# Patient Record
Sex: Female | Born: 1980
Health system: Southern US, Community
[De-identification: ages and names within clinical notes are randomized; demographics above are authoritative.]

## PROBLEM LIST (undated history)

## (undated) ENCOUNTER — Inpatient Hospital Stay (HOSPITAL_COMMUNITY): Payer: Self-pay

## (undated) DIAGNOSIS — Z3490 Encounter for supervision of normal pregnancy, unspecified, unspecified trimester: Secondary | ICD-10-CM

## (undated) DIAGNOSIS — Z9889 Other specified postprocedural states: Secondary | ICD-10-CM

## (undated) DIAGNOSIS — O24419 Gestational diabetes mellitus in pregnancy, unspecified control: Secondary | ICD-10-CM

## (undated) HISTORY — DX: Morbid (severe) obesity due to excess calories: E66.01

## (undated) HISTORY — DX: Other specified postprocedural states: Z98.890

## (undated) HISTORY — DX: Encounter for supervision of normal pregnancy, unspecified, unspecified trimester: Z34.90

## (undated) HISTORY — PX: HERNIA REPAIR: SHX51

---
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
## 2004-12-24 ENCOUNTER — Emergency Department (HOSPITAL_COMMUNITY): Admission: EM | Admit: 2004-12-24 | Discharge: 2004-12-24 | Payer: Self-pay | Admitting: Emergency Medicine

## 2004-12-27 ENCOUNTER — Emergency Department (HOSPITAL_COMMUNITY): Admission: EM | Admit: 2004-12-27 | Discharge: 2004-12-27 | Payer: Self-pay | Admitting: Family Medicine

## 2006-04-14 ENCOUNTER — Emergency Department (HOSPITAL_COMMUNITY): Admission: EM | Admit: 2006-04-14 | Discharge: 2006-04-14 | Payer: Self-pay | Admitting: Emergency Medicine

## 2006-10-09 ENCOUNTER — Inpatient Hospital Stay (HOSPITAL_COMMUNITY): Admission: AD | Admit: 2006-10-09 | Discharge: 2006-10-09 | Payer: Self-pay | Admitting: Gynecology

## 2006-11-24 ENCOUNTER — Emergency Department (HOSPITAL_COMMUNITY): Admission: EM | Admit: 2006-11-24 | Discharge: 2006-11-24 | Payer: Self-pay | Admitting: Emergency Medicine

## 2008-01-13 ENCOUNTER — Emergency Department (HOSPITAL_COMMUNITY): Admission: EM | Admit: 2008-01-13 | Discharge: 2008-01-13 | Payer: Self-pay | Admitting: Emergency Medicine

## 2009-08-03 ENCOUNTER — Emergency Department (HOSPITAL_COMMUNITY): Admission: EM | Admit: 2009-08-03 | Discharge: 2009-08-03 | Payer: Self-pay | Admitting: Emergency Medicine

## 2010-12-05 ENCOUNTER — Ambulatory Visit: Payer: Self-pay | Admitting: Internal Medicine

## 2010-12-15 ENCOUNTER — Ambulatory Visit: Payer: Self-pay | Admitting: Internal Medicine

## 2010-12-16 ENCOUNTER — Telehealth: Payer: Self-pay | Admitting: Family Medicine

## 2010-12-16 ENCOUNTER — Ambulatory Visit (INDEPENDENT_AMBULATORY_CARE_PROVIDER_SITE_OTHER): Payer: 59 | Admitting: Family Medicine

## 2010-12-16 ENCOUNTER — Encounter: Payer: Self-pay | Admitting: Family Medicine

## 2010-12-16 VITALS — BP 106/76 | HR 95 | Temp 99.0°F | Ht 66.25 in | Wt 155.0 lb

## 2010-12-16 DIAGNOSIS — IMO0002 Reserved for concepts with insufficient information to code with codable children: Secondary | ICD-10-CM

## 2010-12-16 DIAGNOSIS — S76319A Strain of muscle, fascia and tendon of the posterior muscle group at thigh level, unspecified thigh, initial encounter: Secondary | ICD-10-CM

## 2010-12-16 MED ORDER — ETODOLAC 500 MG PO TABS: 500.0000 mg | ORAL_TABLET | Freq: Two times a day (BID) | ORAL | Status: AC

## 2010-12-16 NOTE — Progress Notes (Signed)
  Subjective:    Patient ID: Mackenzie Mcgee, female    DOB: [birth year], 30 y.o.   MRN: [id number]  HPI 30 yr old female to establish with [place] and to discuss left leg pain. About 3 months ago she developed a sharp pain behind the left knee which radiates up the back of the thigh to the mid thigh area. No hx of trauma but she does exercise by running on a treadmill about 5 days a week. No foot swelling. No back or buttock pain. No numbness or weakness in the leg. Using Motrin or Tylenol off and on.    Review of Systems  Constitutional: Negative.   Eyes: Negative.   Respiratory: Negative.   Musculoskeletal: Positive for myalgias. Negative for back pain, joint swelling, arthralgias and gait problem.       Objective:   Physical Exam  Constitutional: She appears well-developed and well-nourished.       In no distress. Gets on the exam table easily.   Musculoskeletal: She exhibits no edema.       Mildly tender in the posterior left thigh and the popliteal space. No masses or swelling  Psychiatric:       Tearful and a little anxious           Assessment & Plan:  This is probably a pulled hamstring. Use Etodolac for a month. Rest, use moist heat. No running for one month. Recheck prn

## 2010-12-16 NOTE — Telephone Encounter (Signed)
Pt is req to get a letter written to excuse pt from work from today until 12/26/10. Pt will return to work on 12/29/10. Pt also is req a referral to psychiatrist EA:[id number] at work.

## 2010-12-17 NOTE — Telephone Encounter (Signed)
Pt called to check on status of letter. Pt just wanted to make sure she could get it before she goes back to work on 12/29/10

## 2010-12-18 NOTE — Telephone Encounter (Signed)
No I will not write her out of work for 2 weeks. We never discussed her needing to be out of work. Her leg pain had been going on for 3 months. I cannot refer her to a psychiatrist because we never talked about her job stress at all.

## 2010-12-18 NOTE — Telephone Encounter (Signed)
Pt decline to come in to discuss job stress etc. Pt is aware doc will not take her out of work for 2 wks.

## 2011-01-02 LAB — BASIC METABOLIC PANEL
BUN: 3 — ABNORMAL LOW
Calcium: 9.2
Chloride: 105
Creatinine, Ser: 0.51
GFR calc non Af Amer: >60
Potassium: 3.6
Sodium: 136

## 2011-01-02 LAB — URINALYSIS, ROUTINE W REFLEX MICROSCOPIC
Ketones, ur: >80 — AB
Nitrite: NEGATIVE
Protein, ur: 30 — AB
Urobilinogen, UA: 1

## 2011-01-02 LAB — URINE MICROSCOPIC-ADD ON

## 2011-01-02 LAB — DIFFERENTIAL
Eosinophils Absolute: 0
Eosinophils Relative: 0
Lymphocytes Relative: 10 — ABNORMAL LOW
Lymphs Abs: 1.3
Monocytes Absolute: 0.7
Neutrophils Relative %: 83 — ABNORMAL HIGH

## 2011-01-02 LAB — CBC
HCT: 34.1 — ABNORMAL LOW
MCHC: 34
MCV: 85
Platelets: 280
RDW: 13.7
WBC: 12.5 — ABNORMAL HIGH

## 2011-01-02 LAB — POCT PREGNANCY, URINE: Preg Test, Ur: POSITIVE

## 2011-01-05 LAB — POCT PREGNANCY, URINE
Operator id: 140111
Preg Test, Ur: POSITIVE

## 2011-01-05 LAB — URINALYSIS, DIPSTICK ONLY
Bilirubin Urine: NEGATIVE
Leukocytes, UA: NEGATIVE
Nitrite: NEGATIVE
Protein, ur: NEGATIVE
Specific Gravity, Urine: 1.015
Urobilinogen, UA: 1
pH: 7.5

## 2011-01-05 LAB — URINE MICROSCOPIC-ADD ON

## 2011-01-05 LAB — URINALYSIS, ROUTINE W REFLEX MICROSCOPIC: Protein, ur: 100 — AB

## 2012-03-23 HISTORY — PX: WISDOM TOOTH EXTRACTION: SHX21

## 2014-11-25 ENCOUNTER — Encounter (HOSPITAL_BASED_OUTPATIENT_CLINIC_OR_DEPARTMENT_OTHER): Payer: Self-pay | Admitting: Emergency Medicine

## 2014-11-25 ENCOUNTER — Emergency Department (HOSPITAL_BASED_OUTPATIENT_CLINIC_OR_DEPARTMENT_OTHER)
Admission: EM | Admit: 2014-11-25 | Discharge: 2014-11-25 | Disposition: A | Discharge status: Home / Self Care | Payer: BLUE CROSS/BLUE SHIELD | Attending: Emergency Medicine | Admitting: Emergency Medicine

## 2014-11-25 DIAGNOSIS — Z720 Tobacco use: Secondary | ICD-10-CM | POA: Insufficient documentation

## 2014-11-25 DIAGNOSIS — H18829 Corneal disorder due to contact lens, unspecified eye: Secondary | ICD-10-CM | POA: Insufficient documentation

## 2014-11-25 DIAGNOSIS — T889XXA Complication of surgical and medical care, unspecified, initial encounter: Secondary | ICD-10-CM

## 2014-11-25 MED ORDER — TETRACAINE HCL 0.5 % OP SOLN
1.0000 [drp] | Freq: Once | OPHTHALMIC | Status: DC
  Filled 2014-11-25: qty 2

## 2014-11-25 NOTE — ED Provider Notes (Addendum)
CSN: [id number]     Arrival date & time 11/25/14  0535 History   First MD Initiated Contact with Patient 11/25/14 [phone]     Chief Complaint  Patient presents with  . Eye Problem    HPI Pt  wears hard contact lenses.  She is visiting from [place] and forgot the plunger device she uses to remove her contact lens.    Her eyes are starting to feel dry and she needs to remove her lenses.  No other complaints.  No drainage. History reviewed. No pertinent past medical history. History reviewed. No pertinent past surgical history. Family History  Problem Relation Age of Onset  . Alcohol abuse Father   . Depression Father    Social History  Substance Use Topics  . Smoking status: Current Every Day Smoker -- 0.50 packs/day for 4 years    Types: Cigarettes  . Smokeless tobacco: Never Used  . Alcohol Use: 1.0 oz/week    2 drink(s) per week   OB History    No data available     Review of Systems  Constitutional: Negative for fever.  Eyes: Negative for photophobia and visual disturbance.      Allergies  Review of patient's allergies indicates no known allergies.  Home Medications   Prior to Admission medications   Not on File   BP 99/60 mmHg  Pulse 69  Temp(Src) 98.2 F (36.8 C) (Oral)  Resp 16  Ht  (1.651 m)  Wt 176 lb (79.833 kg)  BMI 29.29 kg/m2  SpO2 100%  LMP 11/21/2014 Physical Exam  Constitutional: She appears well-developed and well-nourished. No distress.  HENT:  Head: Normocephalic and atraumatic.  Right Ear: External ear normal.  Left Ear: External ear normal.  Eyes: Conjunctivae are normal. Right eye exhibits no discharge. Left eye exhibits no discharge. No scleral icterus.  Contact lenses in place  Neck: Neck supple. No tracheal deviation present.  Cardiovascular: Normal rate.   Pulmonary/Chest: Effort normal. No stridor. No respiratory distress.  Musculoskeletal: She exhibits no edema.  Neurological: She is alert. Cranial nerve deficit: no gross  deficits.  Skin: Skin is warm and dry. No rash noted.  Psychiatric: She has a normal mood and affect.  Nursing note and vitals reviewed.   ED Course  Procedures (including critical care time)   MDM   Final diagnoses:  Contact lens stuck, initial encounter    Pt was given the contact lens remover.  She was able to remove the lens on her own without difficulty.    Linwood Dibbles, MD 11/25/14 [phone]

## 2014-11-25 NOTE — ED Notes (Signed)
Patient reports that she can not her contact out.

## 2017-02-16 ENCOUNTER — Inpatient Hospital Stay (HOSPITAL_COMMUNITY)
Admission: AD | Admit: 2017-02-16 | Discharge: 2017-02-16 | Disposition: A | Discharge status: Home / Self Care | Payer: 59 | Source: Ambulatory Visit | Attending: Obstetrics & Gynecology | Admitting: Obstetrics & Gynecology

## 2017-02-16 ENCOUNTER — Encounter (HOSPITAL_COMMUNITY): Payer: Self-pay | Admitting: *Deleted

## 2017-02-16 DIAGNOSIS — N898 Other specified noninflammatory disorders of vagina: Secondary | ICD-10-CM

## 2017-02-16 DIAGNOSIS — Z3483 Encounter for supervision of other normal pregnancy, third trimester: Secondary | ICD-10-CM | POA: Diagnosis present

## 2017-02-16 DIAGNOSIS — O9989 Other specified diseases and conditions complicating pregnancy, childbirth and the puerperium: Secondary | ICD-10-CM | POA: Diagnosis not present

## 2017-02-16 DIAGNOSIS — Z3A35 35 weeks gestation of pregnancy: Secondary | ICD-10-CM | POA: Diagnosis not present

## 2017-02-16 DIAGNOSIS — Z0371 Encounter for suspected problem with amniotic cavity and membrane ruled out: Secondary | ICD-10-CM

## 2017-02-16 HISTORY — DX: Gestational diabetes mellitus in pregnancy, unspecified control: O24.419

## 2017-02-16 LAB — POCT FERN TEST: POCT FERN TEST: NEGATIVE

## 2017-02-16 NOTE — Discharge Instructions (Signed)

## 2017-02-16 NOTE — MAU Note (Signed)
Gush of fluid about 1350.  Denies ctx's, having some pressure. Receives prenatal care in [place]. Was here for Thanksgiving.   Was just told her GBS is positive.

## 2017-02-16 NOTE — MAU Provider Note (Signed)
S: Ms. Mackenzie MallowStephanie M Mcgee is a 36 y.o. G1P0 at 7174w6d  who presents to MAU today complaining of leaking of fluid since 30 minutes PTA. She denies vaginal bleeding. She denies contractions. She reports normal fetal movement.    O: BP 117/60 (BP Location: Left Arm)   Pulse 66   Temp 98.4 F (36.9 C) (Oral)   Resp 16  GENERAL: Well-developed, well-nourished female in no acute distress.  HEAD: Normocephalic, atraumatic.  CHEST: Normal effort of breathing, regular heart rate ABDOMEN: Soft, nontender, gravid PELVIC: Normal external female genitalia. Vagina is pink and rugated. Cervix with normal contour, no lesions. Normal discharge.  No pooling.   Cervical exam:  Dilation: Fingertip Effacement (%): 50 Exam by:: Estanislado SpireE. Mansi Tokar NP   Fetal Monitoring: Baseline: 150 Variability: moderatee Accelerations: 15x15 Decelerations: none Contractions: irr ctx  Results for orders placed or performed during the hospital encounter of 02/16/17 (from the past 24 hour(s))  POCT fern test     Status: None   Collection Time: 02/16/17  3:13 PM  Result Value Ref Range   POCT Fern Test Negative = intact amniotic membranes      A: SIUP at 3174w6d  Membranes intact  P: Discharge home Has scheduled appt tomorrow with OB in [place]  Alonso Gapinski, Denny PeonErin, NP 02/16/2017 3:01 PM

## 2017-02-22 ENCOUNTER — Encounter (HOSPITAL_COMMUNITY): Payer: Self-pay | Admitting: *Deleted

## 2017-02-22 ENCOUNTER — Inpatient Hospital Stay (HOSPITAL_COMMUNITY)
Admission: AD | Admit: 2017-02-22 | Discharge: 2017-02-22 | Disposition: A | Discharge status: Home / Self Care | Payer: 59 | Source: Ambulatory Visit | Attending: Obstetrics and Gynecology | Admitting: Obstetrics and Gynecology

## 2017-02-22 DIAGNOSIS — O26853 Spotting complicating pregnancy, third trimester: Secondary | ICD-10-CM | POA: Insufficient documentation

## 2017-02-22 DIAGNOSIS — O98813 Other maternal infectious and parasitic diseases complicating pregnancy, third trimester: Secondary | ICD-10-CM | POA: Insufficient documentation

## 2017-02-22 DIAGNOSIS — O24419 Gestational diabetes mellitus in pregnancy, unspecified control: Secondary | ICD-10-CM | POA: Insufficient documentation

## 2017-02-22 DIAGNOSIS — Z79899 Other long term (current) drug therapy: Secondary | ICD-10-CM | POA: Insufficient documentation

## 2017-02-22 DIAGNOSIS — B373 Candidiasis of vulva and vagina: Secondary | ICD-10-CM | POA: Insufficient documentation

## 2017-02-22 DIAGNOSIS — Z3A36 36 weeks gestation of pregnancy: Secondary | ICD-10-CM | POA: Diagnosis not present

## 2017-02-22 DIAGNOSIS — Z7984 Long term (current) use of oral hypoglycemic drugs: Secondary | ICD-10-CM | POA: Insufficient documentation

## 2017-02-22 DIAGNOSIS — Z87891 Personal history of nicotine dependence: Secondary | ICD-10-CM | POA: Insufficient documentation

## 2017-02-22 DIAGNOSIS — R109 Unspecified abdominal pain: Secondary | ICD-10-CM | POA: Diagnosis not present

## 2017-02-22 DIAGNOSIS — O26893 Other specified pregnancy related conditions, third trimester: Secondary | ICD-10-CM | POA: Diagnosis not present

## 2017-02-22 DIAGNOSIS — B3731 Acute candidiasis of vulva and vagina: Secondary | ICD-10-CM

## 2017-02-22 DIAGNOSIS — Z3689 Encounter for other specified antenatal screening: Secondary | ICD-10-CM

## 2017-02-22 LAB — WET PREP, GENITAL
Clue Cells Wet Prep HPF POC: NONE SEEN
Sperm: NONE SEEN
Trich, Wet Prep: NONE SEEN
Yeast Wet Prep HPF POC: NONE SEEN

## 2017-02-22 MED ORDER — FLUCONAZOLE 150 MG PO TABS: 150.0000 mg | ORAL_TABLET | Freq: Once | ORAL | 0 refills | Status: AC

## 2017-02-22 NOTE — MAU Provider Note (Signed)
History     CSN: [id number]  Arrival date and time: 02/22/17 1738   First Provider Initiated Contact with Patient 02/22/17 1819      Chief Complaint  Patient presents with  . Abdominal Pain  . Vaginal Bleeding   G1 @36 .5 wks here with spotting. She had 1 episode 3 days ago and another today. She saw pink on the toilet paper. No recent IC. No vaginal discharge, itch, or odor. No LOF or ctx. Reports good FM. She is receiving care in [place] and her pregnancy is complicated by A2GDM on Metformin. She reports EFW 66%ile and nml AFV recently.    OB History    Gravida Para Term Preterm AB Living   1             SAB TAB Ectopic Multiple Live Births                  Past Medical History:  Diagnosis Date  . Gestational diabetes     Past Surgical History:  Procedure Laterality Date  . HERNIA REPAIR    . WISDOM TOOTH EXTRACTION  2014    Family History  Problem Relation Age of Onset  . Alcohol abuse Father   . Depression Father     Social History   Tobacco Use  . Smoking status: Former Smoker    Packs/day: 0.50    Years: 4.00    Pack years: 2.00    Types: Cigarettes  . Smokeless tobacco: Never Used  Substance Use Topics  . Alcohol use: No    Alcohol/week: 1.0 oz    Types: 2 Standard drinks or equivalent per week    Frequency: Never  . Drug use: No    Allergies: No Known Allergies  Medications Prior to Admission  Medication Sig Dispense Refill Last Dose  . metFORMIN (GLUCOPHAGE) 500 MG tablet Take by mouth at bedtime.   02/15/2017 at Unknown time  . ondansetron (ZOFRAN) 4 MG tablet Take 4 mg by mouth every 8 (eight) hours as needed for nausea or vomiting.   01/29/2017  . Prenatal Vit-Fe Fumarate-FA (PRENATAL MULTIVITAMIN) TABS tablet Take 1 tablet by mouth daily at 12 noon.   02/15/2017 at Unknown time    Review of Systems  Gastrointestinal: Negative for abdominal pain.  Genitourinary: Positive for vaginal bleeding. Negative for vaginal discharge.    Physical Exam   Blood pressure (!) 102/58, pulse 82, temperature 98.2 F (36.8 C), resp. rate 16, height 5' 4.5" (1.638 m), weight 212 lb (96.2 kg), SpO2 100 %.  Physical Exam  Nursing note and vitals reviewed. Constitutional: She is oriented to person, place, and time. She appears well-developed and well-nourished. No distress.  HENT:  Head: Normocephalic and atraumatic.  Neck: Normal range of motion.  Cardiovascular: Normal rate.  Respiratory: Effort normal.  GI: Soft. She exhibits no distension. There is no tenderness.  gravid  Genitourinary:  Genitourinary Comments: External: no lesions or erythema Vagina: rugated, pink, moist, moderate thick white curdy and adherent discharge Cervix FT/thick   Musculoskeletal: Normal range of motion.  Neurological: She is alert and oriented to person, place, and time.  Skin: Skin is warm and dry.  Psychiatric: She has a normal mood and affect.  EFM: 140 bpm, mod variability, + accels, no decels Toco: none  Results for orders placed or performed during the hospital encounter of 02/22/17 (from the past 24 hour(s))  Wet prep, genital     Status: Abnormal   Collection Time: 02/22/17  6:35 PM  Result Value Ref Range   Yeast Wet Prep HPF POC NONE SEEN NONE SEEN   Trich, Wet Prep NONE SEEN NONE SEEN   Clue Cells Wet Prep HPF POC NONE SEEN NONE SEEN   WBC, Wet Prep HPF POC MANY (A) NONE SEEN   Sperm NONE SEEN    MAU Course  Procedures  MDM Labs ordered and reviewed. No evidence of PTL or abruption. Spotting likely caused by vaginal irritation from yeast. Will treat with Diflucan. Stable for discharge home.  Assessment and Plan   1. [redacted] weeks gestation of pregnancy   2. NST (non-stress test) reactive   3. Yeast vaginitis    Discharge home Follow up in OB office as scheduled in 2 days PTL precautions  Allergies as of 02/22/2017   No Known Allergies     Medication List    TAKE these medications   fluconazole 150 MG  tablet Commonly known as:  DIFLUCAN Take 1 tablet (150 mg total) by mouth once for 1 dose. May repeat dose on day 4   metFORMIN 500 MG tablet Commonly known as:  GLUCOPHAGE Take by mouth at bedtime.   ondansetron 4 MG tablet Commonly known as:  ZOFRAN Take 4 mg by mouth every 8 (eight) hours as needed for nausea or vomiting.   prenatal multivitamin Tabs tablet Take 1 tablet by mouth daily at 12 noon.      Donette LarryMelanie Eulogia Dismore, CNM 02/22/2017, 7:02 PM

## 2017-02-22 NOTE — Discharge Instructions (Signed)
Vaginal Yeast infection, Adult Vaginal yeast infection is a condition that causes soreness, swelling, and redness (inflammation) of the vagina. It also causes vaginal discharge. This is a common condition. Some women get this infection frequently. What are the causes? This condition is caused by a change in the normal balance of the yeast (candida) and bacteria that live in the vagina. This change causes an overgrowth of yeast, which causes the inflammation. What increases the risk? This condition is more likely to develop in:  Women who take antibiotic medicines.  Women who have diabetes.  Women who take birth control pills.  Women who are pregnant.  Women who douche often.  Women who have a weak defense (immune) system.  Women who have been taking steroid medicines for a long time.  Women who frequently wear tight clothing.  What are the signs or symptoms? Symptoms of this condition include:  White, thick vaginal discharge.  Swelling, itching, redness, and irritation of the vagina. The lips of the vagina (vulva) may be affected as well.  Pain or a burning feeling while urinating.  Pain during sex.  How is this diagnosed? This condition is diagnosed with a medical history and physical exam. This will include a pelvic exam. Your health care provider will examine a sample of your vaginal discharge under a microscope. Your health care provider may send this sample for testing to confirm the diagnosis. How is this treated? This condition is treated with medicine. Medicines may be over-the-counter or prescription. You may be told to use one or more of the following:  Medicine that is taken orally.  Medicine that is applied as a cream.  Medicine that is inserted directly into the vagina (suppository).  Follow these instructions at home:  Take or apply over-the-counter and prescription medicines only as told by your health care provider.  Do not have sex until your health  care provider has approved. Tell your sex partner that you have a yeast infection. That person should go to his or her health care provider if he or she develops symptoms.  Do not wear tight clothes, such as pantyhose or tight pants.  Avoid using tampons until your health care provider approves.  Eat more yogurt. This may help to keep your yeast infection from returning.  Try taking a sitz bath to help with discomfort. This is a warm water bath that is taken while you are sitting down. The water should only come up to your hips and should cover your buttocks. Do this 3-4 times per day or as told by your health care provider.  Do not douche.  Wear breathable, cotton underwear.  If you have diabetes, keep your blood sugar levels under control. Contact a health care provider if:  You have a fever.  Your symptoms go away and then return.  Your symptoms do not get better with treatment.  Your symptoms get worse.  You have new symptoms.  You develop blisters in or around your vagina.  You have blood coming from your vagina and it is not your menstrual period.  You develop pain in your abdomen. This information is not intended to replace advice given to you by your health care provider. Make sure you discuss any questions you have with your health care provider. Document Released: 12/17/2004 Document Revised: 08/21/2015 Document Reviewed: 09/10/2014 Elsevier Interactive Patient Education  2018 [employer] Corporation[employer]xton Hicks Contractions Contractions of the uterus can occur throughout pregnancy, but they are not always a sign that you are  in labor. You may have practice contractions called Braxton Hicks contractions. These false labor contractions are sometimes confused with true labor. What are Deberah PeltonBraxton Hicks contractions? Braxton Hicks contractions are tightening movements that occur in the muscles of the uterus before labor. Unlike true labor contractions, these contractions do not  result in opening (dilation) and thinning of the cervix. Toward the end of pregnancy (32-34 weeks), Braxton Hicks contractions can happen more often and may become stronger. These contractions are sometimes difficult to tell apart from true labor because they can be very uncomfortable. You should not feel embarrassed if you go to the hospital with false labor. Sometimes, the only way to tell if you are in true labor is for your health care provider to look for changes in the cervix. The health care provider will do a physical exam and may monitor your contractions. If you are not in true labor, the exam should show that your cervix is not dilating and your water has not broken. If there are no prenatal problems or other health problems associated with your pregnancy, it is completely safe for you to be sent home with false labor. You may continue to have Braxton Hicks contractions until you go into true labor. How can I tell the difference between true labor and false labor?  Differences ? False labor ? Contractions last 30-70 seconds.: Contractions are usually shorter and not as strong as true labor contractions. ? Contractions become very regular.: Contractions are usually irregular. ? Discomfort is usually felt in the top of the uterus, and it spreads to the lower abdomen and low back.: Contractions are often felt in the front of the lower abdomen and in the groin. ? Contractions do not go away with walking.: Contractions may go away when you walk around or change positions while lying down. ? Contractions usually become more intense and increase in frequency.: Contractions get weaker and are shorter-lasting as time goes on. ? The cervix dilates and gets thinner.: The cervix usually does not dilate or become thin. Follow these instructions at home:  Take over-the-counter and prescription medicines only as told by your health care provider.  Keep up with your usual exercises and follow other  instructions from your health care provider.  Eat and drink lightly if you think you are going into labor.  If Braxton Hicks contractions are making you uncomfortable: ? Change your position from lying down or resting to walking, or change from walking to resting. ? Sit and rest in a tub of warm water. ? Drink enough fluid to keep your urine clear or pale yellow. Dehydration may cause these contractions. ? Do slow and deep breathing several times an hour.  Keep all follow-up prenatal visits as told by your health care provider. This is important. Contact a health care provider if:  You have a fever.  You have continuous pain in your abdomen. Get help right away if:  Your contractions become stronger, more regular, and closer together.  You have fluid leaking or gushing from your vagina.  You pass blood-tinged mucus (bloody show).  You have bleeding from your vagina.  You have low back pain that you never had before.  You feel your babys head pushing down and causing pelvic pressure.  Your baby is not moving inside you as much as it used to. Summary  Contractions that occur before labor are called Braxton Hicks contractions, false labor, or practice contractions.  Braxton Hicks contractions are usually shorter, weaker, farther apart, and  less regular than true labor contractions. True labor contractions usually become progressively stronger and regular and they become more frequent.  Manage discomfort from [facility] Hospital[facility]xton Hicks contractions by changing position, resting in a warm bath, drinking plenty of water, or practicing deep breathing. This information is not intended to replace advice given to you by your health care provider. Make sure you discuss any questions you have with your health care provider. Document Released: 03/09/2005 Document Revised: 01/27/2016 Document Reviewed: 01/27/2016 Elsevier Interactive Patient Education  2017 [employer]

## 2017-02-22 NOTE — MAU Note (Signed)
Pt reports some spotting off/on for 2 days, some pressure in her lower back.

## 2017-02-22 NOTE — MAU Note (Signed)
Urine in lab 

## 2017-02-24 ENCOUNTER — Encounter (HOSPITAL_COMMUNITY): Payer: Self-pay | Admitting: *Deleted

## 2017-02-24 ENCOUNTER — Inpatient Hospital Stay (HOSPITAL_COMMUNITY)
Admission: AD | Admit: 2017-02-24 | Discharge: 2017-02-24 | Disposition: A | Discharge status: Home / Self Care | Payer: 59 | Source: Ambulatory Visit | Attending: Obstetrics and Gynecology | Admitting: Obstetrics and Gynecology

## 2017-02-24 DIAGNOSIS — O9989 Other specified diseases and conditions complicating pregnancy, childbirth and the puerperium: Secondary | ICD-10-CM

## 2017-02-24 DIAGNOSIS — Z87891 Personal history of nicotine dependence: Secondary | ICD-10-CM | POA: Diagnosis not present

## 2017-02-24 DIAGNOSIS — B349 Viral infection, unspecified: Secondary | ICD-10-CM | POA: Diagnosis not present

## 2017-02-24 DIAGNOSIS — O98513 Other viral diseases complicating pregnancy, third trimester: Secondary | ICD-10-CM | POA: Insufficient documentation

## 2017-02-24 DIAGNOSIS — J029 Acute pharyngitis, unspecified: Secondary | ICD-10-CM | POA: Diagnosis present

## 2017-02-24 DIAGNOSIS — Z3A37 37 weeks gestation of pregnancy: Secondary | ICD-10-CM | POA: Diagnosis not present

## 2017-02-24 DIAGNOSIS — O24415 Gestational diabetes mellitus in pregnancy, controlled by oral hypoglycemic drugs: Secondary | ICD-10-CM | POA: Diagnosis not present

## 2017-02-24 LAB — CBC WITH DIFFERENTIAL/PLATELET
Basophils Absolute: 0 10*3/uL (ref 0.0–0.1)
Basophils Relative: 0 %
EOS ABS: 0 10*3/uL (ref 0.0–0.7)
Eosinophils Relative: 0 %
HEMATOCRIT: 36.4 % (ref 36.0–46.0)
HEMOGLOBIN: 11.9 g/dL — AB (ref 12.0–15.0)
LYMPHS ABS: 0.6 10*3/uL — AB (ref 0.7–4.0)
LYMPHS PCT: 5 %
MCH: 28 pg (ref 26.0–34.0)
MCHC: 32.7 g/dL (ref 30.0–36.0)
MCV: 85.6 fL (ref 78.0–100.0)
MONOS PCT: 5 %
Monocytes Absolute: 0.7 10*3/uL (ref 0.1–1.0)
NEUTROS ABS: 12 10*3/uL — AB (ref 1.7–7.7)
NEUTROS PCT: 90 %
Platelets: 210 10*3/uL (ref 150–400)
RBC: 4.25 MIL/uL (ref 3.87–5.11)
RDW: 14.4 % (ref 11.5–15.5)
WBC: 13.4 10*3/uL — ABNORMAL HIGH (ref 4.0–10.5)

## 2017-02-24 LAB — COMPREHENSIVE METABOLIC PANEL
ALK PHOS: 103 U/L (ref 38–126)
ALT: 10 U/L — ABNORMAL LOW (ref 14–54)
ANION GAP: 8 (ref 5–15)
AST: 17 U/L (ref 15–41)
Albumin: 2.9 g/dL — ABNORMAL LOW (ref 3.5–5.0)
BILIRUBIN TOTAL: 0.4 mg/dL (ref 0.3–1.2)
BUN: 6 mg/dL (ref 6–20)
CALCIUM: 8.5 mg/dL — AB (ref 8.9–10.3)
CHLORIDE: 104 mmol/L (ref 101–111)
CO2: 20 mmol/L — AB (ref 22–32)
CREATININE: 0.56 mg/dL (ref 0.44–1.00)
GLUCOSE: 131 mg/dL — AB (ref 65–99)
Potassium: 3.7 mmol/L (ref 3.5–5.1)
Sodium: 132 mmol/L — ABNORMAL LOW (ref 135–145)
Total Protein: 5.7 g/dL — ABNORMAL LOW (ref 6.5–8.1)

## 2017-02-24 LAB — INFLUENZA PANEL BY PCR (TYPE A & B)
INFLBPCR: NEGATIVE
Influenza A By PCR: NEGATIVE

## 2017-02-24 LAB — URINALYSIS, ROUTINE W REFLEX MICROSCOPIC
Bilirubin Urine: NEGATIVE
GLUCOSE, UA: 50 mg/dL — AB
Hgb urine dipstick: NEGATIVE
Ketones, ur: 20 mg/dL — AB
LEUKOCYTES UA: NEGATIVE
Nitrite: NEGATIVE
PROTEIN: NEGATIVE mg/dL
SPECIFIC GRAVITY, URINE: 1.016 (ref 1.005–1.030)
pH: 5 (ref 5.0–8.0)

## 2017-02-24 LAB — RAPID STREP SCREEN (MED CTR MEBANE ONLY): STREPTOCOCCUS, GROUP A SCREEN (DIRECT): NEGATIVE

## 2017-02-24 MED ORDER — LACTATED RINGERS IV BOLUS (SEPSIS)
500.0000 mL | Freq: Once | INTRAVENOUS | Status: AC
  Administered 2017-02-24: 500 mL via INTRAVENOUS

## 2017-02-24 MED ORDER — ACETAMINOPHEN 500 MG PO TABS
500.0000 mg | ORAL_TABLET | Freq: Once | ORAL | Status: AC
  Administered 2017-02-24: 500 mg via ORAL
  Filled 2017-02-24: qty 1

## 2017-02-24 NOTE — Discharge Instructions (Signed)

## 2017-02-24 NOTE — MAU Note (Signed)
PT SAYS SHE WAS HERE THIS AM  FOR YEAST  INFECTION.       SAYS TEMP AT HOME   AT 1030- 99.7   THEN AT 1150PM- 100.1 - SHE CALLED CALL- A NURSE- TOLD  TO COME IN.     ALSO HAS SORE THROAT ., BODY ACHES.    NO   V/D .   SOME NAUSEA- BUT  TAKES ZOFRAN  AT HOME .  [facility] [place]  [facility] - DR Vevelyn RoyalsLISA WILSON

## 2017-02-24 NOTE — MAU Provider Note (Signed)
Chief Complaint:  Fever and Sore Throat   None     HPI: Mackenzie Mcgee is a 36 y.o. G1P0 at 1664w0d who presents to MAU reporting fever and sore throat.  Patient states that she was just seen less than 24 hours ago for her vaginal discharge and treated for yeast infection.  However states that when she was at home she started developing a fever with a T-max of 100.1.  She called nurse and was told to come in.  Patient states that starting today she started having sore throat, body aches.  Denies any nausea or vomiting.  Denies diarrhea.  Patient states that she has had no sick exposures.  Being treated for gestational diabetes.  Receives prenatal care in [place].  Denies contractions, leakage of fluid, vaginal discharge, or vaginal bleeding. Good fetal movement.   Pregnancy Course:   Past Medical History: Past Medical History:  Diagnosis Date  . Gestational diabetes     Past obstetric history: OB History  Gravida Para Term Preterm AB Living  1            SAB TAB Ectopic Multiple Live Births               # Outcome Date GA Lbr Len/2nd Weight Sex Delivery Anes PTL Lv  1 Current               Past Surgical History: Past Surgical History:  Procedure Laterality Date  . HERNIA REPAIR    . WISDOM TOOTH EXTRACTION  2014     Family History: Family History  Problem Relation Age of Onset  . Alcohol abuse Father   . Depression Father     Social History: Social History   Tobacco Use  . Smoking status: Former Smoker    Packs/day: 0.50    Years: 4.00    Pack years: 2.00    Types: Cigarettes  . Smokeless tobacco: Never Used  Substance Use Topics  . Alcohol use: No    Alcohol/week: 1.0 oz    Types: 2 Standard drinks or equivalent per week    Frequency: Never  . Drug use: No    Allergies: No Known Allergies  Meds:  Medications Prior to Admission  Medication Sig Dispense Refill Last Dose  . metFORMIN (GLUCOPHAGE) 500 MG tablet Take by mouth at bedtime.    02/24/2017 at Unknown time  . ondansetron (ZOFRAN) 4 MG tablet Take 4 mg by mouth every 8 (eight) hours as needed for nausea or vomiting.   Past Week at Unknown time  . Prenatal Vit-Fe Fumarate-FA (PRENATAL MULTIVITAMIN) TABS tablet Take 1 tablet by mouth daily at 12 noon.   02/24/2017 at Unknown time    I have reviewed patient's Past Medical Hx, Surgical Hx, Family Hx, Social Hx, medications and allergies.   ROS:  All systems reviewed and are negative for acute change except as noted in the HPI.   Physical Exam   Patient Vitals for the past 24 hrs:  BP Temp Temp src Pulse Resp Height Weight  02/24/17 0048 122/67 99.8 F (37.7 C) Oral (!) 108 20 5' 4.5" (1.638 m) 96.8 kg (213 lb 8 oz)   Constitutional: Well-developed, well-nourished female in no acute distress.  HEENT: NCAT, o/p clear and tacky mucous membranes, no cervical lymphadenopathy, EOMI, no scleral injection or icterus Cardiovascular: tachycardia, normal rhythm, pulses intact Respiratory: normal rate and effort.  GI: Abd soft, non-tender, gravid appropriate for gestational age.  MS: Extremities nontender, no edema, normal ROM  Neurologic: Alert and oriented x 4. No focal deficits GU: Neg CVAT. Psych: normal mood and affect    Labs: Results for orders placed or performed during the hospital encounter of 02/24/17  Culture, group A strep  Result Value Ref Range   Specimen Description THROAT    Special Requests NONE    Culture      NO GROUP A STREP (S.PYOGENES) ISOLATED Performed at [facility], [address] [place], [place] [address]    Report Status 02/26/2017 FINAL   Rapid Strep Screen (Not at [facility])  Result Value Ref Range   Streptococcus, Group A Screen (Direct) NEGATIVE NEGATIVE  Culture, group A strep  Result Value Ref Range   Specimen Description THROAT    Special Requests NONE Reflexed from [id number]    Culture      NO GROUP A STREP (S.PYOGENES) ISOLATED Performed at [facility], [address] [place], [place] [address]    Report Status 02/26/2017 FINAL   CBC with Differential/Platelet  Result Value Ref Range   WBC 13.4 (H) 4.0 - 10.5 K/uL   RBC 4.25 3.87 - 5.11 MIL/uL   Hemoglobin 11.9 (L) 12.0 - 15.0 g/dL   HCT 46.936.4 62.936.0 - 52.846.0 %   MCV 85.6 78.0 - 100.0 fL   MCH 28.0 26.0 - 34.0 pg   MCHC 32.7 30.0 - 36.0 g/dL   RDW 41.314.4 24.411.5 - 01.015.5 %   Platelets 210 150 - 400 K/uL   Neutrophils Relative % 90 %   Neutro Abs 12.0 (H) 1.7 - 7.7 K/uL   Lymphocytes Relative 5 %   Lymphs Abs 0.6 (L) 0.7 - 4.0 K/uL   Monocytes Relative 5 %   Monocytes Absolute 0.7 0.1 - 1.0 K/uL   Eosinophils Relative 0 %   Eosinophils Absolute 0.0 0.0 - 0.7 K/uL   Basophils Relative 0 %   Basophils Absolute 0.0 0.0 - 0.1 K/uL  Comprehensive metabolic panel  Result Value Ref Range   Sodium 132 (L) 135 - 145 mmol/L   Potassium 3.7 3.5 - 5.1 mmol/L   Chloride 104 101 - 111 mmol/L   CO2 20 (L) 22 - 32 mmol/L   Glucose, Bld 131 (H) 65 - 99 mg/dL   BUN 6 6 - 20 mg/dL   Creatinine, Ser 2.720.56 0.44 - 1.00 mg/dL   Calcium 8.5 (L) 8.9 - 10.3 mg/dL   Total Protein 5.7 (L) 6.5 - 8.1 g/dL   Albumin 2.9 (L) 3.5 - 5.0 g/dL   AST 17 15 - 41 U/L   ALT 10 (L) 14 - 54 U/L   Alkaline Phosphatase 103 38 - 126 U/L   Total Bilirubin 0.4 0.3 - 1.2 mg/dL   GFR calc non Af Amer >60 >60 mL/min   GFR calc Af Amer >60 >60 mL/min   Anion gap 8 5 - 15  Influenza panel by PCR (type A & B)  Result Value Ref Range   Influenza A By PCR NEGATIVE NEGATIVE   Influenza B By PCR NEGATIVE NEGATIVE  Urinalysis, Routine w reflex microscopic  Result Value Ref Range   Color, Urine YELLOW YELLOW   APPearance CLEAR CLEAR   Specific Gravity, Urine 1.016 1.005 - 1.030   pH 5.0 5.0 - 8.0   Glucose, UA 50 (A) NEGATIVE mg/dL   Hgb urine dipstick NEGATIVE NEGATIVE   Bilirubin Urine NEGATIVE NEGATIVE   Ketones, ur 20 (A) NEGATIVE mg/dL   Protein, ur NEGATIVE NEGATIVE mg/dL   Nitrite  NEGATIVE NEGATIVE   Leukocytes, UA NEGATIVE NEGATIVE      Imaging:  No results found.  MAU Course: Vitals and nursing notes reviewed I have ordered labs and reviewed them Flu negative Rapid strep negative UA unremarkable CBC with mild leukocytosis with left shift Fetal tachycardia to 1802 on admission with some improvement with fluids and tylenol Treatments given in MAU: Tylenol and LR bolus  Discussed case with Dr. Jolayne Panther  I personally reviewed the patient's NST today, found to be REACTIVE. 170 bpm, mod var, +accels, no decels. CTX: None.   MDM: Plan of care reviewed with patient, including labs and tests ordered and medical treatment.   Assessment: 1. Viral illness   2. Fetal tachycardia before the onset of labor     Plan: Discharge home in stable condition.  Labor precautions and fetal kick counts reviewed Conservative measures for viral illness symtpoms (handout on medications safe in pregnancy given) Handout given Follow-up with OB provider   Caryl Ada, DO OB Fellow [facility], [facility] 02/24/2017 1:26 AM

## 2017-02-26 LAB — CULTURE, GROUP A STREP (THRC)

## 2017-06-09 ENCOUNTER — Encounter (HOSPITAL_COMMUNITY): Payer: Self-pay

## 2020-07-21 DIAGNOSIS — U071 COVID-19: Secondary | ICD-10-CM

## 2020-07-21 HISTORY — DX: COVID-19: U07.1

## 2020-08-05 ENCOUNTER — Emergency Department (INDEPENDENT_AMBULATORY_CARE_PROVIDER_SITE_OTHER)
Admission: EM | Admit: 2020-08-05 | Discharge: 2020-08-05 | Disposition: A | Discharge status: Home / Self Care | Payer: BC Managed Care – PPO | Source: Home / Self Care | Attending: Family Medicine | Admitting: Family Medicine

## 2020-08-05 ENCOUNTER — Encounter: Payer: Self-pay | Admitting: Emergency Medicine

## 2020-08-05 ENCOUNTER — Other Ambulatory Visit: Payer: Self-pay

## 2020-08-05 DIAGNOSIS — U071 COVID-19: Secondary | ICD-10-CM | POA: Diagnosis not present

## 2020-08-05 DIAGNOSIS — Z3201 Encounter for pregnancy test, result positive: Secondary | ICD-10-CM

## 2020-08-05 LAB — POCT URINE PREGNANCY: Preg Test, Ur: POSITIVE — AB

## 2020-08-05 LAB — POC SARS CORONAVIRUS 2 AG -  ED: SARS Coronavirus 2 Ag: POSITIVE — AB

## 2020-08-05 MED ORDER — ACETAMINOPHEN 500 MG PO TABS
1000.0000 mg | ORAL_TABLET | ORAL | Status: AC
  Administered 2020-08-05: 1000 mg via ORAL

## 2020-08-05 NOTE — Discharge Instructions (Addendum)
Take plain guaifenesin (1200mg  extended release tabs such as Mucinex) twice daily, with plenty of water, for cough and congestion.  Get adequate rest.   May take Tylenol as needed for fever, body aches, etc. Try warm salt water gargles for sore throat.  Stop all antihistamines (Allegra, etc) for now, and other non-prescription cough/cold preparations. May take Delsym Cough Suppressant ("12 Hour Cough Relief") at bedtime for nighttime cough.   Because your COVID-19 test is positive, isolate yourself for five days from today.  At the end of five days you may end isolation if your symptoms have cleared or improved, and you have not had a fever for 24 hours. At this time you should wear a mask for five more days when you are around others.   If symptoms become significantly worse during the night or over the weekend, proceed to the local emergency room.

## 2020-08-05 NOTE — ED Triage Notes (Addendum)
Scratchy throat since yesterday  Fever last night  (100)  Temp in triage 102.2  Tylenol last night Positive home COVID test this am  Requests test here for work Pt also found she was pregnant this morning  Covid vaccine - booster [facility] 12/21

## 2020-08-05 NOTE — ED Provider Notes (Signed)
[facility]    CSN: [id number] Arrival date & time: 08/05/20  1011      History   Chief Complaint Chief Complaint  Patient presents with  . Sore Throat  . Fever    HPI Mackenzie Mcgee is a 40 y.o. female.   Patient developed sore throat yesterday along with myalgias, nasal congestion, fatigue, and headache.  Today she developed fever to 100.  This morning she had positive home pregnancy and COVID19 tests.  She denies pelvic/abdominal pain and vaginal bleeding.  Patient's last menstrual period was 07/12/2020 (exact date).  Patient has had COVID19 vaccination and a Moderna booster.  The history is provided by the patient.    Past Medical History:  Diagnosis Date  . Gestational diabetes     There are no problems to display for this patient.   Past Surgical History:  Procedure Laterality Date  . HERNIA REPAIR    . WISDOM TOOTH EXTRACTION  2014    OB History    Gravida  2   Para      Term      Preterm      AB      Living        SAB      IAB      Ectopic      Multiple      Live Births               Home Medications    Prior to Admission medications   Medication Sig Start Date End Date Taking? Authorizing Provider  metFORMIN (GLUCOPHAGE) 500 MG tablet Take by mouth at bedtime. Patient not taking: Reported on 08/05/2020    [provider]  ondansetron (ZOFRAN) 4 MG tablet Take 4 mg by mouth every 8 (eight) hours as needed for nausea or vomiting. Patient not taking: Reported on 08/05/2020    [provider]  Prenatal Vit-Fe Fumarate-FA (PRENATAL MULTIVITAMIN) TABS tablet Take 1 tablet by mouth daily at 12 noon. Patient not taking: Reported on 08/05/2020    [provider]    Family History Family History  Problem Relation Age of Onset  . Alcohol abuse Father   . Depression Father   . Healthy Mother     Social History Social History   Tobacco Use  . Smoking status: Former Smoker     Packs/day: 0.50    Years: 4.00    Pack years: 2.00    Types: Cigarettes  . Smokeless tobacco: Never Used  Substance Use Topics  . Alcohol use: No  . Drug use: No     Allergies   Patient has no known allergies.   Review of Systems Review of Systems  + sore throat No cough No pleuritic pain No wheezing + nasal congestion + post-nasal drainage No sinus pain/pressure No itchy/red eyes No earache No hemoptysis No SOB + fever, + chills No nausea No vomiting No abdominal pain No vaginal discharge or bleeding. No diarrhea No urinary symptoms No skin rash + fatigue + myalgias + headache Used OTC meds (Allegra) without relief    Physical Exam Triage Vital Signs ED Triage Vitals  Enc Vitals Group     BP 08/05/20 1132 99/66     Pulse Rate 08/05/20 1132 (!) 121     Resp 08/05/20 1132 17     Temp 08/05/20 1132 (!) 102.2 F (39 C)     Temp Source 08/05/20 1132 Oral     SpO2 08/05/20 1132 98 %  Weight 08/05/20 1133 183 lb (83 kg)     Height 08/05/20 1133 5' 4.5" (1.638 m)     Head Circumference --      Peak Flow --      Pain Score --      Pain Loc --      Pain Edu? --      Excl. in GC? --    No data found.  Updated Vital Signs BP 99/66 (BP Location: Right Arm) Comment: normal is 113 SBP  Pulse (!) 121   Temp (!) 102.2 F (39 C) (Oral)   Resp 17   Ht 5' 4.5" (1.638 m)   Wt 83 kg   LMP 07/12/2020 (Exact Date)   SpO2 98%   Breastfeeding No   BMI 30.93 kg/m   Visual Acuity Right Eye Distance:   Left Eye Distance:   Bilateral Distance:    Right Eye Near:   Left Eye Near:    Bilateral Near:     Physical Exam Nursing notes and Vital Signs reviewed. Appearance:  Patient appears stated age, and in no acute distress Eyes:  Pupils are equal, round, and reactive to light and accomodation.  Extraocular movement is intact.  Conjunctivae are not inflamed  Ears:  Canals normal.  Tympanic membranes normal.  Nose:  Mildly congested turbinates.  No sinus  tenderness.  Pharynx:  Normal Neck:  Supple.  Mildly enlarged lateral nodes are present, tender to palpation on the left.   Lungs:  Clear to auscultation.  Breath sounds are equal.  Moving air well. Heart:  Regular rate and rhythm without murmurs, rubs, or gallops.  Abdomen:  Nontender without masses or hepatosplenomegaly.  Bowel sounds are present.  No CVA or flank tenderness.  Extremities:  No edema.  Skin:  No rash present.   UC Treatments / Results  Labs (all labs ordered are listed, but only abnormal results are displayed) Labs Reviewed  POC SARS CORONAVIRUS 2 AG -  ED - Abnormal; Notable for the following components:      Result Value   SARS Coronavirus 2 Ag Positive (*)    All other components within normal limits  POCT URINE PREGNANCY    EKG   Radiology No results found.  Procedures Procedures (including critical care time)  Medications Ordered in UC Medications  acetaminophen (TYLENOL) tablet 1,000 mg (1,000 mg Oral Given 08/05/20 1145)    Initial Impression / Assessment and Plan / UC Course  I have reviewed the triage vital signs and the nursing notes.  Pertinent labs & imaging results that were available during my care of the patient were reviewed by me and considered in my medical decision making (see chart for details).    Benign exam.  Treat symptomatically for now  COVID19 PCR and urine pregnancy test pending.   Final Clinical Impressions(s) / UC Diagnoses   Final diagnoses:  COVID-19 virus infection  Positive urine pregnancy test     Discharge Instructions     Take plain guaifenesin (1200mg  extended release tabs such as Mucinex) twice daily, with plenty of water, for cough and congestion.  Get adequate rest.   May take Tylenol as needed for fever, body aches, etc. Try warm salt water gargles for sore throat.  Stop all antihistamines (Allegra, etc) for now, and other non-prescription cough/cold preparations. May take Delsym Cough Suppressant ("12  Hour Cough Relief") at bedtime for nighttime cough.   Because your COVID-19 test is positive, isolate yourself for five days from today.  At the end of five days you may end isolation if your symptoms have cleared or improved, and you have not had a fever for 24 hours. At this time you should wear a mask for five more days when you are around others.   If symptoms become significantly worse during the night or over the weekend, proceed to the local emergency room.          ED Prescriptions    None        Lattie Haw, MD 08/08/20 1651

## 2020-08-06 ENCOUNTER — Encounter: Payer: Self-pay | Admitting: Nurse Practitioner

## 2020-08-06 ENCOUNTER — Other Ambulatory Visit: Payer: Self-pay | Admitting: Nurse Practitioner

## 2020-08-06 ENCOUNTER — Telehealth: Payer: Self-pay

## 2020-08-06 DIAGNOSIS — U071 COVID-19: Secondary | ICD-10-CM

## 2020-08-06 DIAGNOSIS — Z3490 Encounter for supervision of normal pregnancy, unspecified, unspecified trimester: Secondary | ICD-10-CM | POA: Insufficient documentation

## 2020-08-06 NOTE — Telephone Encounter (Signed)
Called to discuss with patient about COVID-19 symptoms and the use of one of the available treatments for those with mild to moderate Covid symptoms and at a high risk of hospitalization.  Pt may appear to qualify for outpatient treatment due to co-morbid conditions and/or a member of an at-risk group in accordance with the FDA Emergency Use Authorization.    Symptom onset: Sunday, May 15th, 2022 Vaccinated: Yes Booster: Yes Immunocompromised: No Qualifiers: Pregnant NIH Criteria: Tier 3?  Pt is interested in treatment options. RN informed pt that an APP will follow up to discuss options.  Essie Hart, RN

## 2020-08-06 NOTE — Progress Notes (Signed)
I connected by phone with Lucas Mallow on 08/06/2020 at 3:29 PM to discuss the potential use of a new treatment for mild to moderate COVID-19 viral infection in non-hospitalized patients.  This patient is a 40 y.o. female that meets the FDA criteria for Emergency Use Authorization of COVID monoclonal antibody bebtelovimab.  Has a (+) direct SARS-CoV-2 viral test result  Has mild or moderate COVID-19   Is NOT hospitalized due to COVID-19  Is within 10 days of symptom onset  Has at least one of the high risk factor(s) for progression to severe COVID-19 and/or hospitalization as defined in EUA.  Specific high risk criteria : BMI > 25 and Pregnancy   I have spoken and communicated the following to the patient or parent/caregiver regarding COVID monoclonal antibody treatment:  1. FDA has authorized the emergency use for the treatment of mild to moderate COVID-19 in adults and pediatric patients with positive results of direct SARS-CoV-2 viral testing who are 78 years of age and older weighing at least 40 kg, and who are at high risk for progressing to severe COVID-19 and/or hospitalization.  2. The significant known and potential risks and benefits of COVID monoclonal antibody, and the extent to which such potential risks and benefits are unknown.  3. Information on available alternative treatments and the risks and benefits of those alternatives, including clinical trials.  4. Patients treated with COVID monoclonal antibody should continue to self-isolate and use infection control measures (e.g., wear mask, isolate, social distance, avoid sharing personal items, clean and disinfect "high touch" surfaces, and frequent handwashing) according to CDC guidelines.   5. The patient or parent/caregiver has the option to accept or refuse COVID monoclonal antibody treatment.  6. Discussion about the monoclonal antibody infusion does not ensure treatment. The patient will be placed on a list  and scheduled according to risk, symptom onset and availability. A scheduler will reach to the patient to let them know if we can accommodate their infusion or not.  After reviewing this information with the patient, the patient has agreed to receive one of the available covid 19 monoclonal antibodies and will be provided an appropriate fact sheet prior to infusion.   Nicolasa Ducking, NP 08/06/2020 3:29 PM

## 2020-08-09 ENCOUNTER — Ambulatory Visit: Payer: BC Managed Care – PPO

## 2020-09-15 ENCOUNTER — Other Ambulatory Visit: Payer: Self-pay

## 2020-09-15 DIAGNOSIS — E669 Obesity, unspecified: Secondary | ICD-10-CM | POA: Diagnosis not present

## 2020-09-15 DIAGNOSIS — R632 Polyphagia: Secondary | ICD-10-CM | POA: Diagnosis not present

## 2020-09-15 DIAGNOSIS — Z818 Family history of other mental and behavioral disorders: Secondary | ICD-10-CM | POA: Diagnosis not present

## 2020-09-15 DIAGNOSIS — R0602 Shortness of breath: Secondary | ICD-10-CM | POA: Diagnosis present

## 2020-09-15 DIAGNOSIS — Z79899 Other long term (current) drug therapy: Secondary | ICD-10-CM | POA: Diagnosis not present

## 2020-09-15 DIAGNOSIS — Z8616 Personal history of COVID-19: Secondary | ICD-10-CM | POA: Diagnosis not present

## 2020-09-15 DIAGNOSIS — U071 COVID-19: Secondary | ICD-10-CM | POA: Diagnosis not present

## 2020-09-15 DIAGNOSIS — Z6830 Body mass index (BMI) 30.0-30.9, adult: Secondary | ICD-10-CM | POA: Diagnosis not present

## 2020-09-15 DIAGNOSIS — Z7982 Long term (current) use of aspirin: Secondary | ICD-10-CM | POA: Diagnosis not present

## 2020-09-15 DIAGNOSIS — E876 Hypokalemia: Secondary | ICD-10-CM | POA: Diagnosis not present

## 2020-09-15 DIAGNOSIS — Z811 Family history of alcohol abuse and dependence: Secondary | ICD-10-CM | POA: Diagnosis not present

## 2020-09-15 DIAGNOSIS — R5383 Other fatigue: Secondary | ICD-10-CM | POA: Diagnosis not present

## 2020-09-15 DIAGNOSIS — H5500 Unspecified nystagmus: Secondary | ICD-10-CM | POA: Diagnosis not present

## 2020-09-15 DIAGNOSIS — Z87891 Personal history of nicotine dependence: Secondary | ICD-10-CM | POA: Diagnosis not present

## 2020-09-15 DIAGNOSIS — E860 Dehydration: Secondary | ICD-10-CM | POA: Diagnosis present

## 2020-09-15 DIAGNOSIS — K5900 Constipation, unspecified: Secondary | ICD-10-CM | POA: Diagnosis not present

## 2020-09-15 DIAGNOSIS — H0520 Unspecified exophthalmos: Secondary | ICD-10-CM | POA: Diagnosis not present

## 2020-09-15 DIAGNOSIS — I7771 Dissection of carotid artery: Secondary | ICD-10-CM | POA: Diagnosis not present

## 2020-09-15 DIAGNOSIS — R112 Nausea with vomiting, unspecified: Secondary | ICD-10-CM | POA: Diagnosis not present

## 2020-09-15 DIAGNOSIS — E1165 Type 2 diabetes mellitus with hyperglycemia: Secondary | ICD-10-CM | POA: Insufficient documentation

## 2020-09-15 DIAGNOSIS — E0810 Diabetes mellitus due to underlying condition with ketoacidosis without coma: Secondary | ICD-10-CM | POA: Diagnosis not present

## 2020-09-15 DIAGNOSIS — H02402 Unspecified ptosis of left eyelid: Secondary | ICD-10-CM | POA: Diagnosis not present

## 2020-09-15 DIAGNOSIS — E872 Acidosis: Secondary | ICD-10-CM | POA: Diagnosis not present

## 2020-09-15 DIAGNOSIS — E1110 Type 2 diabetes mellitus with ketoacidosis without coma: Secondary | ICD-10-CM | POA: Diagnosis not present

## 2020-09-15 DIAGNOSIS — K30 Functional dyspepsia: Secondary | ICD-10-CM | POA: Diagnosis not present

## 2020-09-15 DIAGNOSIS — Z7984 Long term (current) use of oral hypoglycemic drugs: Secondary | ICD-10-CM | POA: Diagnosis not present

## 2020-09-15 NOTE — ED Triage Notes (Signed)
Pt reports she had covid at the end of may and since she has felt fatigued and SOB. Also reports increased thirst and urination. She checked her blood sugar tonight and it was 377. Reports hx of gestational diabetes but is not diabetic

## 2020-09-16 ENCOUNTER — Inpatient Hospital Stay (HOSPITAL_BASED_OUTPATIENT_CLINIC_OR_DEPARTMENT_OTHER)
Admission: EM | Admit: 2020-09-16 | Discharge: 2020-09-18 | DRG: 637 | Disposition: A | Discharge status: Home / Self Care | Payer: BC Managed Care – PPO | Attending: Internal Medicine | Admitting: Internal Medicine

## 2020-09-16 ENCOUNTER — Emergency Department (HOSPITAL_BASED_OUTPATIENT_CLINIC_OR_DEPARTMENT_OTHER)
Admission: EM | Admit: 2020-09-16 | Discharge: 2020-09-16 | Disposition: A | Discharge status: Home / Self Care | Payer: BC Managed Care – PPO | Source: Home / Self Care | Attending: Emergency Medicine | Admitting: Emergency Medicine

## 2020-09-16 ENCOUNTER — Encounter (HOSPITAL_BASED_OUTPATIENT_CLINIC_OR_DEPARTMENT_OTHER): Payer: Self-pay | Admitting: *Deleted

## 2020-09-16 ENCOUNTER — Encounter (HOSPITAL_BASED_OUTPATIENT_CLINIC_OR_DEPARTMENT_OTHER): Payer: Self-pay | Admitting: Emergency Medicine

## 2020-09-16 ENCOUNTER — Telehealth: Payer: Self-pay

## 2020-09-16 ENCOUNTER — Other Ambulatory Visit: Payer: Self-pay

## 2020-09-16 DIAGNOSIS — K30 Functional dyspepsia: Secondary | ICD-10-CM | POA: Diagnosis present

## 2020-09-16 DIAGNOSIS — R632 Polyphagia: Secondary | ICD-10-CM | POA: Diagnosis present

## 2020-09-16 DIAGNOSIS — Z7982 Long term (current) use of aspirin: Secondary | ICD-10-CM

## 2020-09-16 DIAGNOSIS — E0810 Diabetes mellitus due to underlying condition with ketoacidosis without coma: Secondary | ICD-10-CM

## 2020-09-16 DIAGNOSIS — R0602 Shortness of breath: Secondary | ICD-10-CM

## 2020-09-16 DIAGNOSIS — Z7984 Long term (current) use of oral hypoglycemic drugs: Secondary | ICD-10-CM

## 2020-09-16 DIAGNOSIS — Z87891 Personal history of nicotine dependence: Secondary | ICD-10-CM

## 2020-09-16 DIAGNOSIS — I7771 Dissection of carotid artery: Secondary | ICD-10-CM | POA: Diagnosis present

## 2020-09-16 DIAGNOSIS — Z6830 Body mass index (BMI) 30.0-30.9, adult: Secondary | ICD-10-CM

## 2020-09-16 DIAGNOSIS — H02402 Unspecified ptosis of left eyelid: Secondary | ICD-10-CM | POA: Diagnosis present

## 2020-09-16 DIAGNOSIS — Z811 Family history of alcohol abuse and dependence: Secondary | ICD-10-CM

## 2020-09-16 DIAGNOSIS — E860 Dehydration: Secondary | ICD-10-CM | POA: Diagnosis present

## 2020-09-16 DIAGNOSIS — Z818 Family history of other mental and behavioral disorders: Secondary | ICD-10-CM

## 2020-09-16 DIAGNOSIS — E1110 Type 2 diabetes mellitus with ketoacidosis without coma: Secondary | ICD-10-CM | POA: Diagnosis present

## 2020-09-16 DIAGNOSIS — E669 Obesity, unspecified: Secondary | ICD-10-CM | POA: Diagnosis present

## 2020-09-16 DIAGNOSIS — U071 COVID-19: Secondary | ICD-10-CM | POA: Diagnosis present

## 2020-09-16 DIAGNOSIS — Z79899 Other long term (current) drug therapy: Secondary | ICD-10-CM

## 2020-09-16 DIAGNOSIS — R112 Nausea with vomiting, unspecified: Secondary | ICD-10-CM

## 2020-09-16 DIAGNOSIS — E8729 Other acidosis: Secondary | ICD-10-CM | POA: Diagnosis present

## 2020-09-16 DIAGNOSIS — E1165 Type 2 diabetes mellitus with hyperglycemia: Secondary | ICD-10-CM

## 2020-09-16 DIAGNOSIS — Z8616 Personal history of COVID-19: Secondary | ICD-10-CM

## 2020-09-16 DIAGNOSIS — E876 Hypokalemia: Secondary | ICD-10-CM | POA: Diagnosis present

## 2020-09-16 HISTORY — DX: Type 2 diabetes mellitus with ketoacidosis without coma: E11.10

## 2020-09-16 LAB — CBG MONITORING, ED
Glucose-Capillary: 192 mg/dL — ABNORMAL HIGH (ref 70–99)
Glucose-Capillary: 197 mg/dL — ABNORMAL HIGH (ref 70–99)
Glucose-Capillary: 220 mg/dL — ABNORMAL HIGH (ref 70–99)
Glucose-Capillary: 237 mg/dL — ABNORMAL HIGH (ref 70–99)
Glucose-Capillary: 301 mg/dL — ABNORMAL HIGH (ref 70–99)

## 2020-09-16 LAB — URINALYSIS, ROUTINE W REFLEX MICROSCOPIC
Bilirubin Urine: NEGATIVE
Glucose, UA: >=500 mg/dL — AB
Glucose, UA: >=500 mg/dL — AB
Ketones, ur: >80 mg/dL — AB
Ketones, ur: >80 mg/dL — AB
Leukocytes,Ua: NEGATIVE
Leukocytes,Ua: NEGATIVE
Nitrite: NEGATIVE
Nitrite: NEGATIVE
Protein, ur: 30 mg/dL — AB
Protein, ur: NEGATIVE mg/dL
Specific Gravity, Urine: >1.03 — ABNORMAL HIGH (ref 1.005–1.030)
Specific Gravity, Urine: >1.03 — ABNORMAL HIGH (ref 1.005–1.030)
pH: 5.5 (ref 5.0–8.0)
pH: 5.5 (ref 5.0–8.0)

## 2020-09-16 LAB — BASIC METABOLIC PANEL
Anion gap: 16 — ABNORMAL HIGH (ref 5–15)
Anion gap: 15 (ref 5–15)
Anion gap: 12 (ref 5–15)
BUN: 11 mg/dL (ref 6–20)
BUN: 7 mg/dL (ref 6–20)
BUN: 7 mg/dL (ref 6–20)
CO2: 10 mmol/L — ABNORMAL LOW (ref 22–32)
CO2: 9 mmol/L — ABNORMAL LOW (ref 22–32)
CO2: 11 mmol/L — ABNORMAL LOW (ref 22–32)
Calcium: 8.3 mg/dL — ABNORMAL LOW (ref 8.9–10.3)
Calcium: 8.8 mg/dL — ABNORMAL LOW (ref 8.9–10.3)
Calcium: 8 mg/dL — ABNORMAL LOW (ref 8.9–10.3)
Chloride: 106 mmol/L (ref 98–111)
Chloride: 109 mmol/L (ref 98–111)
Chloride: 110 mmol/L (ref 98–111)
Creatinine, Ser: 0.78 mg/dL (ref 0.44–1.00)
Creatinine, Ser: 0.74 mg/dL (ref 0.44–1.00)
Creatinine, Ser: 0.58 mg/dL (ref 0.44–1.00)
GFR, Estimated: >60 mL/min (ref >60)
GFR, Estimated: >60 mL/min (ref >60)
GFR, Estimated: >60 mL/min (ref >60)
Glucose, Bld: 312 mg/dL — ABNORMAL HIGH (ref 70–99)
Glucose, Bld: 229 mg/dL — ABNORMAL HIGH (ref 70–99)
Glucose, Bld: 205 mg/dL — ABNORMAL HIGH (ref 70–99)
Potassium: 3.8 mmol/L (ref 3.5–5.1)
Potassium: 4 mmol/L (ref 3.5–5.1)
Potassium: 3.9 mmol/L (ref 3.5–5.1)
Sodium: 132 mmol/L — ABNORMAL LOW (ref 135–145)
Sodium: 133 mmol/L — ABNORMAL LOW (ref 135–145)
Sodium: 133 mmol/L — ABNORMAL LOW (ref 135–145)

## 2020-09-16 LAB — I-STAT VENOUS BLOOD GAS, ED
Acid-base deficit: 16 mmol/L — ABNORMAL HIGH (ref 0.0–2.0)
Bicarbonate: 11.3 mmol/L — ABNORMAL LOW (ref 20.0–28.0)
Calcium, Ion: 1.25 mmol/L (ref 1.15–1.40)
HCT: 40 % (ref 36.0–46.0)
Hemoglobin: 13.6 g/dL (ref 12.0–15.0)
O2 Saturation: 38 %
Patient temperature: 98.6
Potassium: 3.9 mmol/L (ref 3.5–5.1)
Sodium: 136 mmol/L (ref 135–145)
TCO2: 12 mmol/L — ABNORMAL LOW (ref 22–32)
pCO2, Ven: 29.7 mmHg — ABNORMAL LOW (ref 44.0–60.0)
pH, Ven: 7.187 — CL (ref 7.250–7.430)
pO2, Ven: 27 mmHg — CL (ref 32.0–45.0)

## 2020-09-16 LAB — CBC WITH DIFFERENTIAL/PLATELET
Abs Immature Granulocytes: 0.04 10*3/uL (ref 0.00–0.07)
Basophils Absolute: 0.1 10*3/uL (ref 0.0–0.1)
Basophils Relative: 1 %
Eosinophils Absolute: 0.1 10*3/uL (ref 0.0–0.5)
Eosinophils Relative: 1 %
HCT: 46.2 % — ABNORMAL HIGH (ref 36.0–46.0)
Hemoglobin: 15.8 g/dL — ABNORMAL HIGH (ref 12.0–15.0)
Immature Granulocytes: 0 %
Lymphocytes Relative: 17 %
Lymphs Abs: 1.9 10*3/uL (ref 0.7–4.0)
MCH: 28.2 pg (ref 26.0–34.0)
MCHC: 34.2 g/dL (ref 30.0–36.0)
MCV: 82.5 fL (ref 80.0–100.0)
Monocytes Absolute: 1.1 10*3/uL — ABNORMAL HIGH (ref 0.1–1.0)
Monocytes Relative: 10 %
Neutro Abs: 8 10*3/uL — ABNORMAL HIGH (ref 1.7–7.7)
Neutrophils Relative %: 71 %
Platelets: 323 10*3/uL (ref 150–400)
RBC: 5.6 MIL/uL — ABNORMAL HIGH (ref 3.87–5.11)
RDW: 13.4 % (ref 11.5–15.5)
WBC: 11.2 10*3/uL — ABNORMAL HIGH (ref 4.0–10.5)
nRBC: 0 % (ref 0.0–0.2)

## 2020-09-16 LAB — URINALYSIS, MICROSCOPIC (REFLEX)

## 2020-09-16 LAB — LACTIC ACID, PLASMA: Lactic Acid, Venous: 1.5 mmol/L (ref 0.5–1.9)

## 2020-09-16 LAB — PREGNANCY, URINE: Preg Test, Ur: NEGATIVE

## 2020-09-16 MED ORDER — DEXTROSE-NACL 5-0.9 % IV SOLN: INTRAVENOUS | Status: DC

## 2020-09-16 MED ORDER — SODIUM CHLORIDE 0.9 % IV BOLUS
1000.0000 mL | Freq: Once | INTRAVENOUS | Status: AC
  Administered 2020-09-16: 01:00:00 1000 mL via INTRAVENOUS

## 2020-09-16 MED ORDER — METFORMIN HCL 500 MG PO TABS
500.0000 mg | ORAL_TABLET | Freq: Once | ORAL | Status: AC
  Administered 2020-09-16: 02:00:00 500 mg via ORAL
  Filled 2020-09-16: qty 1

## 2020-09-16 MED ORDER — POTASSIUM CHLORIDE 10 MEQ/100ML IV SOLN
10.0000 meq | INTRAVENOUS | Status: AC
  Administered 2020-09-16: 10 meq via INTRAVENOUS
  Filled 2020-09-16 (×2): qty 100

## 2020-09-16 MED ORDER — LACTATED RINGERS IV SOLN: INTRAVENOUS | Status: DC

## 2020-09-16 MED ORDER — INSULIN REGULAR(HUMAN) IN NACL 100-0.9 UT/100ML-% IV SOLN
INTRAVENOUS | Status: DC
  Administered 2020-09-16: 5 [IU]/h via INTRAVENOUS
  Administered 2020-09-17: 0.3 [IU]/h via INTRAVENOUS
  Filled 2020-09-16: qty 100

## 2020-09-16 MED ORDER — DEXTROSE IN LACTATED RINGERS 5 % IV SOLN: INTRAVENOUS | Status: DC

## 2020-09-16 MED ORDER — DEXTROSE 50 % IV SOLN: 0.0000 mL | INTRAVENOUS | Status: DC | PRN

## 2020-09-16 MED ORDER — METFORMIN HCL 500 MG PO TABS: 500.0000 mg | ORAL_TABLET | Freq: Two times a day (BID) | ORAL | 0 refills | Status: DC

## 2020-09-16 MED ORDER — LACTATED RINGERS IV BOLUS
2000.0000 mL | Freq: Once | INTRAVENOUS | Status: AC
  Administered 2020-09-16: 2000 mL via INTRAVENOUS

## 2020-09-16 NOTE — ED Notes (Signed)
No change in Insulin gtt / dosage  Still at 5 units per hour

## 2020-09-16 NOTE — ED Provider Notes (Signed)
[facility] EMERGENCY DEPARTMENT Provider Note   CSN: [id number] Arrival date & time: 09/15/20  2349     History Chief Complaint  Patient presents with   Hyperglycemia    Mackenzie Mcgee is a 40 y.o. female.  The history is provided by the patient.  Hyperglycemia Blood sugar level PTA:  377 Severity:  Moderate Onset quality:  Gradual Duration: months. Timing:  Constant Progression:  Unchanged Chronicity:  Recurrent (gestational DM and "pre diabetes") Current diabetic treatments: none. Context: recent illness   Relieved by:  Nothing Ineffective treatments:  None tried Associated symptoms: increased thirst and polyuria   Associated symptoms: no abdominal pain, no blurred vision, no chest pain, no confusion, no diaphoresis, no dizziness, no fever and no nausea   Risk factors: no recent steroid use       Past Medical History:  Diagnosis Date   COVID-19 virus infection 07/2020   Gestational diabetes    Morbid obesity [facility])    Pregnancy     Patient Active Problem List   Diagnosis Date Noted   Morbid obesity (HCC)    Pregnancy    COVID-19 virus infection 07/2020    Past Surgical History:  Procedure Laterality Date   HERNIA REPAIR     WISDOM TOOTH EXTRACTION  2014     OB History     Gravida  2   Para      Term      Preterm      AB      Living         SAB      IAB      Ectopic      Multiple      Live Births              Family History  Problem Relation Age of Onset   Alcohol abuse Father    Depression Father    Healthy Mother     Social History   Tobacco Use   Smoking status: Former    Packs/day: 0.50    Years: 4.00    Pack years: 2.00    Types: Cigarettes   Smokeless tobacco: Never  Vaping Use   Vaping Use: Never used  Substance Use Topics   Alcohol use: Not Currently   Drug use: No    Home Medications Prior to Admission medications   Medication Sig Start Date End Date Taking? Authorizing Provider   metFORMIN (GLUCOPHAGE) 500 MG tablet Take 1 tablet (500 mg total) by mouth 2 (two) times daily with a meal. 09/16/20  Yes Laquandra Carrillo, MD  metFORMIN (GLUCOPHAGE) 500 MG tablet Take by mouth at bedtime. Patient not taking: Reported on 08/05/2020    [provider]  ondansetron (ZOFRAN) 4 MG tablet Take 4 mg by mouth every 8 (eight) hours as needed for nausea or vomiting. Patient not taking: Reported on 08/05/2020    [provider]  Prenatal Vit-Fe Fumarate-FA (PRENATAL MULTIVITAMIN) TABS tablet Take 1 tablet by mouth daily at 12 noon. Patient not taking: Reported on 08/05/2020    [provider]    Allergies    Patient has no known allergies.  Review of Systems   Review of Systems  Constitutional:  Negative for diaphoresis and fever.  HENT:  Negative for drooling.   Eyes:  Negative for blurred vision and redness.  Respiratory:  Negative for wheezing.   Cardiovascular:  Negative for chest pain.  Gastrointestinal:  Negative for abdominal pain and nausea.  Endocrine:  Positive for polydipsia and polyuria.  Genitourinary:  Negative for difficulty urinating.  Musculoskeletal:  Negative for neck stiffness.  Skin:  Negative for wound.  Neurological:  Negative for dizziness.  Psychiatric/Behavioral:  Negative for confusion.   All other systems reviewed and are negative.  Physical Exam Updated Vital Signs BP 113/62 (BP Location: Left Arm)   Pulse 81   Temp 98.4 F (36.9 C) (Oral)   Resp 14   Ht 5\' 4"  (1.626 m)   Wt 80.7 kg   LMP 07/12/2020 (Exact Date)   SpO2 100%   Breastfeeding No   BMI 30.55 kg/m   Physical Exam Vitals and nursing note reviewed.  Constitutional:      General: She is not in acute distress.    Appearance: Normal appearance.  HENT:     Head: Normocephalic and atraumatic.     Nose: Nose normal.  Eyes:     Conjunctiva/sclera: Conjunctivae normal.     Pupils: Pupils are equal, round, and reactive to light.  Cardiovascular:      Rate and Rhythm: Normal rate and regular rhythm.     Pulses: Normal pulses.     Heart sounds: Normal heart sounds.  Pulmonary:     Effort: Pulmonary effort is normal.     Breath sounds: Normal breath sounds.  Abdominal:     General: Abdomen is flat. Bowel sounds are normal.     Palpations: Abdomen is soft.     Tenderness: There is no abdominal tenderness.  Musculoskeletal:        General: Normal range of motion.     Cervical back: Normal range of motion and neck supple.  Skin:    General: Skin is warm and dry.     Capillary Refill: Capillary refill takes less than 2 seconds.  Neurological:     General: No focal deficit present.     Mental Status: She is alert and oriented to person, place, and time.     Deep Tendon Reflexes: Reflexes normal.  Psychiatric:        Mood and Affect: Mood normal.        Behavior: Behavior normal.    ED Results / Procedures / Treatments   Labs (all labs ordered are listed, but only abnormal results are displayed) Results for orders placed or performed during the hospital encounter of 09/16/20  Urinalysis, Routine w reflex microscopic Urine, Clean Catch  Result Value Ref Range   Color, Urine YELLOW YELLOW   APPearance CLEAR CLEAR   Specific Gravity, Urine >1.030 (H) 1.005 - 1.030   pH 5.5 5.0 - 8.0   Glucose, UA >=500 (A) NEGATIVE mg/dL   Hgb urine dipstick TRACE (A) NEGATIVE   Bilirubin Urine NEGATIVE NEGATIVE   Ketones, ur >80 (A) NEGATIVE mg/dL   Protein, ur NEGATIVE NEGATIVE mg/dL   Nitrite NEGATIVE NEGATIVE   Leukocytes,Ua NEGATIVE NEGATIVE  Pregnancy, urine  Result Value Ref Range   Preg Test, Ur NEGATIVE NEGATIVE  Basic metabolic panel  Result Value Ref Range   Sodium 132 (L) 135 - 145 mmol/L   Potassium 3.8 3.5 - 5.1 mmol/L   Chloride 106 98 - 111 mmol/L   CO2 10 (L) 22 - 32 mmol/L   Glucose, Bld 312 (H) 70 - 99 mg/dL   BUN 11 6 - 20 mg/dL   Creatinine, Ser 09/18/20 0.44 - 1.00 mg/dL   Calcium 8.3 (L) 8.9 - 10.3 mg/dL   GFR,  Estimated 4.27 >06 mL/min   Anion gap 16 (H) 5 -  15  Urinalysis, Microscopic (reflex)  Result Value Ref Range   RBC / HPF 0-5 0 - 5 RBC/hpf   WBC, UA 0-5 0 - 5 WBC/hpf   Bacteria, UA RARE (A) NONE SEEN   Squamous Epithelial / LPF 0-5 0 - 5   Budding Yeast PRESENT   CBG monitoring, ED  Result Value Ref Range   Glucose-Capillary 301 (H) 70 - 99 mg/dL   No results found.  None  Radiology No results found.  Procedures Procedures   Medications Ordered in ED Medications  sodium chloride 0.9 % bolus 1,000 mL (1,000 mLs Intravenous New Bag/Given 09/16/20 0117)  metFORMIN (GLUCOPHAGE) tablet 500 mg (500 mg Oral Given 09/16/20 0139)    ED Course  I have reviewed the triage vital signs and the nursing notes.  Pertinent labs & imaging results that were available during my care of the patient were reviewed by me and considered in my medical decision making (see chart for details).  Given IVF in the ED.  Sugar has improved.  I believe this is a type 2 diabetic and she has had elevated sugar for some time as she was previously "prediabetic" before her last child.  Will start metformin and has referred patient back to PMD for blood work and education.  Patient verbalizes understanding and agrees to follow up.    ORIANA HORIUCHI was evaluated in Emergency Department on 09/16/2020 for the symptoms described in the history of present illness. She was evaluated in the context of the global COVID-19 pandemic, which necessitated consideration that the patient might be at risk for infection with the SARS-CoV-2 virus that causes COVID-19. Institutional protocols and algorithms that pertain to the evaluation of patients at risk for COVID-19 are in a state of rapid change based on information released by regulatory bodies including the CDC and federal and state organizations. These policies and algorithms were followed during the patient's care in the ED.  Final Clinical Impression(s) / ED  Diagnoses Final diagnoses:  Hyperglycemia due to diabetes mellitus (HCC)    Return for intractable cough, coughing up blood, fevers > 100.4 unrelieved by medication, shortness of breath, intractable vomiting, chest pain, shortness of breath, weakness, numbness, changes in speech, facial asymmetry, abdominal pain, passing out, Inability to tolerate liquids or food, cough, altered mental status or any concerns. No signs of systemic illness or infection. The patient is nontoxic-appearing on exam and vital signs are within normal limits. I have reviewed the triage vital signs and the nursing notes. Pertinent labs & imaging results that were available during my care of the patient were reviewed by me and considered in my medical decision making (see chart for details). After history, exam, and medical workup I feel the patient has been appropriately medically screened and is safe for discharge home. Pertinent diagnoses were discussed with the patient. Patient was given return precautions.  Rx / DC Orders ED Discharge Orders          Ordered    metFORMIN (GLUCOPHAGE) 500 MG tablet  2 times daily with meals        09/16/20 0156             Rozelle Caudle, MD 09/16/20 9480

## 2020-09-16 NOTE — ED Provider Notes (Signed)
[facility] EMERGENCY DEPARTMENT Provider Note   CSN: [id number] Arrival date & time: 09/16/20  1848     History Chief Complaint  Patient presents with   Shortness of Breath    Mackenzie Mcgee is a 40 y.o. female.  40 yo F with a chief complaints of nausea and vomiting.  The patient had come in yesterday with polydipsia polyphagia and polyuria.  Also feeling generally fatigued.  She was started on metformin yesterday and started experience nausea and vomiting.  Not really able to eat and drink well at home.  She has home ketone urine test from when she was pregnant and had gestational diabetes.  She took 1 yesterday was positive and then took 1 again today it was also positive and came back to the ED for evaluation.  The history is provided by the patient.  Shortness of Breath Associated symptoms: vomiting   Associated symptoms: no abdominal pain, no chest pain, no fever, no headaches and no wheezing   Illness Severity:  Moderate Onset quality:  Gradual Duration:  3 days Timing:  Constant Progression:  Worsening Chronicity:  New Associated symptoms: nausea, shortness of breath and vomiting   Associated symptoms: no abdominal pain, no chest pain, no congestion, no fever, no headaches, no myalgias, no rhinorrhea and no wheezing       Past Medical History:  Diagnosis Date   COVID-19 virus infection 07/2020   Gestational diabetes    Morbid obesity [facility])    Pregnancy     Patient Active Problem List   Diagnosis Date Noted   Morbid obesity (HCC)    Pregnancy    COVID-19 virus infection 07/2020    Past Surgical History:  Procedure Laterality Date   HERNIA REPAIR     WISDOM TOOTH EXTRACTION  2014     OB History     Gravida  2   Para      Term      Preterm      AB      Living         SAB      IAB      Ectopic      Multiple      Live Births              Family History  Problem Relation Age of Onset   Alcohol abuse Father     Depression Father    Healthy Mother     Social History   Tobacco Use   Smoking status: Former    Packs/day: 0.50    Years: 4.00    Pack years: 2.00    Types: Cigarettes   Smokeless tobacco: Never  Vaping Use   Vaping Use: Never used  Substance Use Topics   Alcohol use: Not Currently   Drug use: No    Home Medications Prior to Admission medications   Medication Sig Start Date End Date Taking? Authorizing Provider  metFORMIN (GLUCOPHAGE) 500 MG tablet Take by mouth at bedtime. Patient not taking: Reported on 08/05/2020    [provider]  metFORMIN (GLUCOPHAGE) 500 MG tablet Take 1 tablet (500 mg total) by mouth 2 (two) times daily with a meal. 09/16/20   Palumbo, April, MD  ondansetron (ZOFRAN) 4 MG tablet Take 4 mg by mouth every 8 (eight) hours as needed for nausea or vomiting. Patient not taking: Reported on 08/05/2020    [provider]  Prenatal Vit-Fe Fumarate-FA (PRENATAL MULTIVITAMIN) TABS tablet Take 1 tablet by  mouth daily at 12 noon. Patient not taking: Reported on 08/05/2020    [provider]    Allergies    Patient has no known allergies.  Review of Systems   Review of Systems  Constitutional:  Negative for chills and fever.  HENT:  Negative for congestion and rhinorrhea.   Eyes:  Negative for redness and visual disturbance.  Respiratory:  Positive for shortness of breath. Negative for wheezing.   Cardiovascular:  Negative for chest pain and palpitations.  Gastrointestinal:  Positive for nausea and vomiting. Negative for abdominal pain.  Genitourinary:  Negative for dysuria and urgency.  Musculoskeletal:  Negative for arthralgias and myalgias.  Skin:  Negative for pallor and wound.  Neurological:  Negative for dizziness and headaches.   Physical Exam Updated Vital Signs BP (!) 135/57 (BP Location: Right Arm)   Pulse 77   Temp 97.9 F (36.6 C) (Oral)   Resp 16   Ht 5\' 4"  (1.626 m)   Wt 80.7 kg   SpO2 100%   BMI 30.55 kg/m    Physical Exam Vitals and nursing note reviewed.  Constitutional:      General: She is not in acute distress.    Appearance: She is well-developed. She is not diaphoretic.  HENT:     Head: Normocephalic and atraumatic.  Eyes:     Pupils: Pupils are equal, round, and reactive to light.  Cardiovascular:     Rate and Rhythm: Normal rate and regular rhythm.     Heart sounds: No murmur heard.   No friction rub. No gallop.  Pulmonary:     Effort: Pulmonary effort is normal.     Breath sounds: No wheezing or rales.  Abdominal:     General: There is no distension.     Palpations: Abdomen is soft.     Tenderness: There is no abdominal tenderness.  Musculoskeletal:        General: No tenderness.     Cervical back: Normal range of motion and neck supple.  Skin:    General: Skin is warm and dry.  Neurological:     Mental Status: She is alert and oriented to person, place, and time.  Psychiatric:        Behavior: Behavior normal.    ED Results / Procedures / Treatments   Labs (all labs ordered are listed, but only abnormal results are displayed) Labs Reviewed  CBC WITH DIFFERENTIAL/PLATELET - Abnormal; Notable for the following components:      Result Value   WBC 11.2 (*)    RBC 5.60 (*)    Hemoglobin 15.8 (*)    HCT 46.2 (*)    Neutro Abs 8.0 (*)    Monocytes Absolute 1.1 (*)    All other components within normal limits  BASIC METABOLIC PANEL - Abnormal; Notable for the following components:   Sodium 133 (*)    CO2 9 (*)    Glucose, Bld 229 (*)    Calcium 8.8 (*)    All other components within normal limits  URINALYSIS, ROUTINE W REFLEX MICROSCOPIC - Abnormal; Notable for the following components:   APPearance HAZY (*)    Specific Gravity, Urine >1.030 (*)    Glucose, UA >=500 (*)    Hgb urine dipstick TRACE (*)    Bilirubin Urine SMALL (*)    Ketones, ur >80 (*)    Protein, ur 30 (*)    All other components within normal limits  URINALYSIS, MICROSCOPIC (REFLEX) -  Abnormal; Notable for the  following components:   Bacteria, UA RARE (*)    All other components within normal limits  CBG MONITORING, ED - Abnormal; Notable for the following components:   Glucose-Capillary 237 (*)    All other components within normal limits  CBG MONITORING, ED - Abnormal; Notable for the following components:   Glucose-Capillary 197 (*)    All other components within normal limits  RESP PANEL BY RT-PCR (FLU A&B, COVID) ARPGX2  BETA-HYDROXYBUTYRIC ACID  BLOOD GAS, VENOUS  BASIC METABOLIC PANEL  BASIC METABOLIC PANEL  BASIC METABOLIC PANEL  BASIC METABOLIC PANEL  BETA-HYDROXYBUTYRIC ACID  BETA-HYDROXYBUTYRIC ACID  LACTIC ACID, PLASMA  LACTIC ACID, PLASMA  I-STAT VENOUS BLOOD GAS, ED    EKG None  Radiology No results found.  Procedures Procedures   Medications Ordered in ED Medications  insulin regular, human (MYXREDLIN) 100 units/ 100 mL infusion (has no administration in time range)  lactated ringers infusion (has no administration in time range)  dextrose 50 % solution 0-50 mL (has no administration in time range)  potassium chloride 10 mEq in 100 mL IVPB (has no administration in time range)  dextrose 5 %-0.9 % sodium chloride infusion (has no administration in time range)  lactated ringers bolus 2,000 mL (0 mLs Intravenous Stopped 09/16/20 2125)    ED Course  I have reviewed the triage vital signs and the nursing notes.  Pertinent labs & imaging results that were available during my care of the patient were reviewed by me and considered in my medical decision making (see chart for details).    MDM Rules/Calculators/A&P                          40 yo F with a chief complaints of nausea and vomiting after starting metformin yesterday.  She has a history of gestational diabetes and had been borderline diabetic for some time.  She recently moved here from [place] and is unable to see a family physician in the office.  Was seen here yesterday and  had a laboratory evaluation concerning for diabetes.  At that point she had had some improvement with IV fluids and was discharged home.  Will recheck blood work here.  Bolus of IV fluids.  Reassess.  Patient feeling somewhat better after IV fluids.  Lab work rechecked here with worsening acidosis.  Bicarb now single digits.  She still is well-appearing and nontoxic.  Her gap is technically closed with level of 15.  She denies any history of diarrhea.  Her ketones are still greater than 80.  I feel like the most likely diagnosis with diabetic ketoacidosis I guess she also could just be profoundly dehydrated the does not appear clinically so.  Will discuss with medicine for admission.  CRITICAL CARE Performed by: Rae Roam   Total critical care time: 35 minutes  Critical care time was exclusive of separately billable procedures and treating other patients.  Critical care was necessary to treat or prevent imminent or life-threatening deterioration.  Critical care was time spent personally by me on the following activities: development of treatment plan with patient and/or surrogate as well as nursing, discussions with consultants, evaluation of patient's response to treatment, examination of patient, obtaining history from patient or surrogate, ordering and performing treatments and interventions, ordering and review of laboratory studies, ordering and review of radiographic studies, pulse oximetry and re-evaluation of patient's condition.   Final Clinical Impression(s) / ED Diagnoses Final diagnoses:  DKA, type 2, not  at goal [facility])    Rx / DC Orders ED Discharge Orders     None        Melene Plan, DO 09/16/20 2144

## 2020-09-16 NOTE — Telephone Encounter (Signed)
Caller Phone Number [phone] Patient Name Mackenzie Mcgee Patient DOB 1980/08/06 Call Type Message Only Information Provided Reason for Call Request for General Office Information:  Initial Comment Caller has a new patient appointment for July 6 she went the ER last night and was told she had blood sugar issues and given Metformin. Her blood sugar was at 379. She would like to see if the office could do blood work before her appointment.

## 2020-09-16 NOTE — ED Triage Notes (Signed)
Pt presents to ED Pov. Pt c/o SOB, n/v, fatigue. Pt reports that she was seen here yesterday sent home with metformin d/t hyperglycemia seen her yesterday. Pt reports at home urine test for ketones was positive.

## 2020-09-16 NOTE — ED Notes (Signed)
Pt. Reports being here last night and she is still not feeling well.  Pt. Said she is still having some issues with  Her breathing.  No noted wheezes and no noted shortness of breath only noted quick breathing off and on with the pt.

## 2020-09-16 NOTE — Telephone Encounter (Signed)
Pt has call back again to see is she is able to do blood work before the NPV. I have informed her that we can do that only after she is established. She sound take the new medication that was given to her and if she needs to she can go to the urgent care. Pt reported understanding and confirmed her NPV on 09/25/20.

## 2020-09-17 ENCOUNTER — Observation Stay (HOSPITAL_COMMUNITY): Payer: BC Managed Care – PPO

## 2020-09-17 DIAGNOSIS — Z6830 Body mass index (BMI) 30.0-30.9, adult: Secondary | ICD-10-CM | POA: Diagnosis not present

## 2020-09-17 DIAGNOSIS — E876 Hypokalemia: Secondary | ICD-10-CM | POA: Diagnosis present

## 2020-09-17 DIAGNOSIS — R632 Polyphagia: Secondary | ICD-10-CM | POA: Diagnosis present

## 2020-09-17 DIAGNOSIS — Z87891 Personal history of nicotine dependence: Secondary | ICD-10-CM | POA: Diagnosis not present

## 2020-09-17 DIAGNOSIS — E860 Dehydration: Secondary | ICD-10-CM | POA: Diagnosis present

## 2020-09-17 DIAGNOSIS — R112 Nausea with vomiting, unspecified: Secondary | ICD-10-CM | POA: Diagnosis not present

## 2020-09-17 DIAGNOSIS — H5500 Unspecified nystagmus: Secondary | ICD-10-CM | POA: Diagnosis not present

## 2020-09-17 DIAGNOSIS — R5383 Other fatigue: Secondary | ICD-10-CM | POA: Diagnosis not present

## 2020-09-17 DIAGNOSIS — H02402 Unspecified ptosis of left eyelid: Secondary | ICD-10-CM | POA: Diagnosis present

## 2020-09-17 DIAGNOSIS — Z818 Family history of other mental and behavioral disorders: Secondary | ICD-10-CM | POA: Diagnosis not present

## 2020-09-17 DIAGNOSIS — E1110 Type 2 diabetes mellitus with ketoacidosis without coma: Principal | ICD-10-CM

## 2020-09-17 DIAGNOSIS — Z8616 Personal history of COVID-19: Secondary | ICD-10-CM | POA: Diagnosis not present

## 2020-09-17 DIAGNOSIS — Z79899 Other long term (current) drug therapy: Secondary | ICD-10-CM | POA: Diagnosis not present

## 2020-09-17 DIAGNOSIS — R0602 Shortness of breath: Secondary | ICD-10-CM

## 2020-09-17 DIAGNOSIS — Z7984 Long term (current) use of oral hypoglycemic drugs: Secondary | ICD-10-CM | POA: Diagnosis not present

## 2020-09-17 DIAGNOSIS — I7771 Dissection of carotid artery: Secondary | ICD-10-CM

## 2020-09-17 DIAGNOSIS — E0810 Diabetes mellitus due to underlying condition with ketoacidosis without coma: Secondary | ICD-10-CM | POA: Diagnosis not present

## 2020-09-17 DIAGNOSIS — K30 Functional dyspepsia: Secondary | ICD-10-CM | POA: Diagnosis present

## 2020-09-17 DIAGNOSIS — U071 COVID-19: Secondary | ICD-10-CM | POA: Diagnosis not present

## 2020-09-17 DIAGNOSIS — K5900 Constipation, unspecified: Secondary | ICD-10-CM | POA: Diagnosis not present

## 2020-09-17 DIAGNOSIS — E669 Obesity, unspecified: Secondary | ICD-10-CM | POA: Diagnosis present

## 2020-09-17 DIAGNOSIS — Z7982 Long term (current) use of aspirin: Secondary | ICD-10-CM | POA: Diagnosis not present

## 2020-09-17 DIAGNOSIS — E872 Acidosis: Secondary | ICD-10-CM | POA: Diagnosis not present

## 2020-09-17 DIAGNOSIS — H0520 Unspecified exophthalmos: Secondary | ICD-10-CM | POA: Diagnosis not present

## 2020-09-17 DIAGNOSIS — Z811 Family history of alcohol abuse and dependence: Secondary | ICD-10-CM | POA: Diagnosis not present

## 2020-09-17 DIAGNOSIS — E8729 Other acidosis: Secondary | ICD-10-CM | POA: Diagnosis present

## 2020-09-17 HISTORY — DX: Unspecified ptosis of left eyelid: H02.402

## 2020-09-17 HISTORY — DX: Dissection of carotid artery: I77.71

## 2020-09-17 LAB — GLUCOSE, CAPILLARY
Glucose-Capillary: 186 mg/dL — ABNORMAL HIGH (ref 70–99)
Glucose-Capillary: 173 mg/dL — ABNORMAL HIGH (ref 70–99)
Glucose-Capillary: 171 mg/dL — ABNORMAL HIGH (ref 70–99)

## 2020-09-17 LAB — BASIC METABOLIC PANEL
Anion gap: 8 (ref 5–15)
Anion gap: 10 (ref 5–15)
Anion gap: 8 (ref 5–15)
Anion gap: 9 (ref 5–15)
BUN: 5 mg/dL — ABNORMAL LOW (ref 6–20)
BUN: <5 mg/dL — ABNORMAL LOW (ref 6–20)
BUN: <5 mg/dL — ABNORMAL LOW (ref 6–20)
BUN: 7 mg/dL (ref 6–20)
CO2: 14 mmol/L — ABNORMAL LOW (ref 22–32)
CO2: 14 mmol/L — ABNORMAL LOW (ref 22–32)
CO2: 16 mmol/L — ABNORMAL LOW (ref 22–32)
CO2: 17 mmol/L — ABNORMAL LOW (ref 22–32)
Calcium: 7.5 mg/dL — ABNORMAL LOW (ref 8.9–10.3)
Calcium: 8.2 mg/dL — ABNORMAL LOW (ref 8.9–10.3)
Calcium: 8.1 mg/dL — ABNORMAL LOW (ref 8.9–10.3)
Calcium: 8.5 mg/dL — ABNORMAL LOW (ref 8.9–10.3)
Chloride: 112 mmol/L — ABNORMAL HIGH (ref 98–111)
Chloride: 112 mmol/L — ABNORMAL HIGH (ref 98–111)
Chloride: 111 mmol/L (ref 98–111)
Chloride: 111 mmol/L (ref 98–111)
Creatinine, Ser: 0.55 mg/dL (ref 0.44–1.00)
Creatinine, Ser: 0.46 mg/dL (ref 0.44–1.00)
Creatinine, Ser: 0.54 mg/dL (ref 0.44–1.00)
Creatinine, Ser: 0.56 mg/dL (ref 0.44–1.00)
GFR, Estimated: >60 mL/min (ref >60)
GFR, Estimated: >60 mL/min (ref >60)
GFR, Estimated: >60 mL/min (ref >60)
GFR, Estimated: >60 mL/min (ref >60)
Glucose, Bld: 179 mg/dL — ABNORMAL HIGH (ref 70–99)
Glucose, Bld: 227 mg/dL — ABNORMAL HIGH (ref 70–99)
Glucose, Bld: 199 mg/dL — ABNORMAL HIGH (ref 70–99)
Glucose, Bld: 218 mg/dL — ABNORMAL HIGH (ref 70–99)
Potassium: 3.2 mmol/L — ABNORMAL LOW (ref 3.5–5.1)
Potassium: 3.4 mmol/L — ABNORMAL LOW (ref 3.5–5.1)
Potassium: 3.8 mmol/L (ref 3.5–5.1)
Potassium: 3.5 mmol/L (ref 3.5–5.1)
Sodium: 134 mmol/L — ABNORMAL LOW (ref 135–145)
Sodium: 136 mmol/L (ref 135–145)
Sodium: 135 mmol/L (ref 135–145)
Sodium: 137 mmol/L (ref 135–145)

## 2020-09-17 LAB — CBG MONITORING, ED
Glucose-Capillary: 105 mg/dL — ABNORMAL HIGH (ref 70–99)
Glucose-Capillary: 121 mg/dL — ABNORMAL HIGH (ref 70–99)
Glucose-Capillary: 149 mg/dL — ABNORMAL HIGH (ref 70–99)
Glucose-Capillary: 150 mg/dL — ABNORMAL HIGH (ref 70–99)
Glucose-Capillary: 158 mg/dL — ABNORMAL HIGH (ref 70–99)
Glucose-Capillary: 188 mg/dL — ABNORMAL HIGH (ref 70–99)
Glucose-Capillary: 200 mg/dL — ABNORMAL HIGH (ref 70–99)

## 2020-09-17 LAB — BETA-HYDROXYBUTYRIC ACID
Beta-Hydroxybutyric Acid: 6.74 mmol/L — ABNORMAL HIGH (ref 0.05–0.27)
Beta-Hydroxybutyric Acid: 3.23 mmol/L — ABNORMAL HIGH (ref 0.05–0.27)
Beta-Hydroxybutyric Acid: 4.28 mmol/L — ABNORMAL HIGH (ref 0.05–0.27)
Beta-Hydroxybutyric Acid: 2.29 mmol/L — ABNORMAL HIGH (ref 0.05–0.27)

## 2020-09-17 LAB — RESP PANEL BY RT-PCR (FLU A&B, COVID) ARPGX2
Influenza A by PCR: NEGATIVE
Influenza B by PCR: NEGATIVE
SARS Coronavirus 2 by RT PCR: POSITIVE — AB

## 2020-09-17 MED ORDER — INSULIN ASPART 100 UNIT/ML IJ SOLN
3.0000 [IU] | Freq: Three times a day (TID) | INTRAMUSCULAR | Status: DC
  Administered 2020-09-17 – 2020-09-18 (×2): 3 [IU] via SUBCUTANEOUS

## 2020-09-17 MED ORDER — INSULIN REGULAR(HUMAN) IN NACL 100-0.9 UT/100ML-% IV SOLN
INTRAVENOUS | Status: DC
  Filled 2020-09-17: qty 100

## 2020-09-17 MED ORDER — INSULIN GLARGINE 100 UNIT/ML ~~LOC~~ SOLN
16.0000 [IU] | SUBCUTANEOUS | Status: DC
  Administered 2020-09-17 – 2020-09-18 (×2): 16 [IU] via SUBCUTANEOUS
  Filled 2020-09-17 (×2): qty 0.16

## 2020-09-17 MED ORDER — INSULIN ASPART 100 UNIT/ML IJ SOLN
0.0000 [IU] | INTRAMUSCULAR | Status: DC
  Administered 2020-09-17 – 2020-09-18 (×4): 3 [IU] via SUBCUTANEOUS

## 2020-09-17 MED ORDER — POTASSIUM CHLORIDE 10 MEQ/100ML IV SOLN
10.0000 meq | INTRAVENOUS | Status: DC
  Administered 2020-09-17 (×2): 10 meq via INTRAVENOUS
  Filled 2020-09-17: qty 100

## 2020-09-17 MED ORDER — INSULIN ASPART 100 UNIT/ML IJ SOLN: 0.0000 [IU] | Freq: Every day | INTRAMUSCULAR | Status: DC

## 2020-09-17 MED ORDER — ACETAMINOPHEN 325 MG PO TABS: 650.0000 mg | ORAL_TABLET | Freq: Four times a day (QID) | ORAL | Status: DC | PRN

## 2020-09-17 MED ORDER — POTASSIUM CHLORIDE CRYS ER 20 MEQ PO TBCR
40.0000 meq | EXTENDED_RELEASE_TABLET | Freq: Once | ORAL | Status: AC
  Administered 2020-09-17: 40 meq via ORAL
  Filled 2020-09-17: qty 2

## 2020-09-17 MED ORDER — ZINC SULFATE 220 (50 ZN) MG PO CAPS
220.0000 mg | ORAL_CAPSULE | Freq: Every day | ORAL | Status: DC
  Administered 2020-09-17 – 2020-09-18 (×2): 220 mg via ORAL
  Filled 2020-09-17 (×2): qty 1

## 2020-09-17 MED ORDER — ONDANSETRON HCL 4 MG/2ML IJ SOLN
4.0000 mg | Freq: Once | INTRAMUSCULAR | Status: AC
  Administered 2020-09-17: 4 mg via INTRAVENOUS
  Filled 2020-09-17: qty 2

## 2020-09-17 MED ORDER — DEXTROSE IN LACTATED RINGERS 5 % IV SOLN: INTRAVENOUS | Status: DC

## 2020-09-17 MED ORDER — POTASSIUM CHLORIDE 10 MEQ/100ML IV SOLN
10.0000 meq | Freq: Once | INTRAVENOUS | Status: AC
  Administered 2020-09-17: 10 meq via INTRAVENOUS
  Filled 2020-09-17: qty 100

## 2020-09-17 MED ORDER — ENOXAPARIN SODIUM 40 MG/0.4ML IJ SOSY
40.0000 mg | PREFILLED_SYRINGE | INTRAMUSCULAR | Status: DC
  Administered 2020-09-17: 40 mg via SUBCUTANEOUS
  Filled 2020-09-17: qty 0.4

## 2020-09-17 MED ORDER — LACTATED RINGERS IV SOLN: INTRAVENOUS | Status: DC

## 2020-09-17 MED ORDER — GADOBUTROL 1 MMOL/ML IV SOLN
8.0000 mL | Freq: Once | INTRAVENOUS | Status: AC | PRN
  Administered 2020-09-17: 8 mL via INTRAVENOUS

## 2020-09-17 MED ORDER — IOHEXOL 350 MG/ML SOLN
75.0000 mL | Freq: Once | INTRAVENOUS | Status: AC | PRN
  Administered 2020-09-17: 75 mL via INTRAVENOUS

## 2020-09-17 MED ORDER — ONDANSETRON HCL 4 MG/2ML IJ SOLN: 4.0000 mg | Freq: Four times a day (QID) | INTRAMUSCULAR | Status: DC | PRN

## 2020-09-17 MED ORDER — INSULIN ASPART 100 UNIT/ML IJ SOLN
0.0000 [IU] | Freq: Three times a day (TID) | INTRAMUSCULAR | Status: DC
  Administered 2020-09-17: 3 [IU] via SUBCUTANEOUS

## 2020-09-17 MED ORDER — ASCORBIC ACID 500 MG PO TABS
500.0000 mg | ORAL_TABLET | Freq: Every day | ORAL | Status: DC
  Administered 2020-09-17 – 2020-09-18 (×2): 500 mg via ORAL
  Filled 2020-09-17 (×2): qty 1

## 2020-09-17 MED ORDER — DEXTROSE 50 % IV SOLN: 0.0000 mL | INTRAVENOUS | Status: DC | PRN

## 2020-09-17 MED ORDER — INSULIN GLARGINE 100 UNIT/ML ~~LOC~~ SOLN
12.0000 [IU] | Freq: Once | SUBCUTANEOUS | Status: AC
  Administered 2020-09-17: 12 [IU] via SUBCUTANEOUS
  Filled 2020-09-17: qty 1

## 2020-09-17 NOTE — H&P (Signed)
[facility] History and Physical   Patient: Mackenzie MallowStephanie M Mcgee ZOX:[id number]   PCP: System, Provider Not In DOB: 01/28/1981   DOA: 09/16/2020   DOS: 09/17/2020   DOS: the patient was seen and examined on 09/17/2020  Patient coming from: The patient is coming from Home  Chief Complaint: Nausea and vomiting  HPI: Mackenzie MallowStephanie M Mcgee is a 40 y.o. female with Past medical history of recent COVID-19 infection, gestational diabetes, morbid obesity. Patient presented with complaints of nausea and vomiting. Patient was only seen in the ER on 6/27 for polydipsia and polyuria.  Found to have hyperglycemia and suspected to have diabetes and was prescribed metformin and sent home. After taking metformin at home she started having nausea and vomiting and therefore Decided to come to the hospital. Denies any abdominal pain nausea at the time of my evaluation.  No fever no chills.  No cough no shortness of breath.  No diarrhea.  Had a bowel movement 2 days ago.  Passing gas.  No dizziness or lightheadedness. No swelling in the leg.  No pain in the leg. Other than metformin she does not take any significant medication. She took metformin in the past and did not have any problem when she had gestational diabetes. On exam patient was found to have left eye ptosis.  She noticed that this was an acute complaint. I asked her to review her recent photographs on phone and she appeared to have ptosis even on 09/09/2020. Denies any other focal deficit. No smoking no alcohol abuse no drug abuse. She denies any possibility of pregnancy and tells me that she is having some spotting today. Last menstrual period was in May end. She practices intermittent fasting and exercise she has been able to lose weight. However since she has been diagnosed with COVID she has not performed intermittent fasting. Patient tested positive for COVID-19 on 5/16.  Was recommended to have monoclonal antibody but did not receive it  due to financial issues.  Has not received any other treatment for COVID.  ED Course: Found to have metabolic acidosis without any anion gap with elevated beta hydroxybutyric acid.  Treated as DKA with IV fluids and IV insulin.  Was transition to subcutaneous insulin.  Was referred for admission.  Review of Systems: as mentioned in the history of present illness.  All other systems reviewed and are negative.  Past Medical History:  Diagnosis Date   COVID-19 virus infection 07/2020   Gestational diabetes    Morbid obesity (HCC)    Pregnancy    Past Surgical History:  Procedure Laterality Date   HERNIA REPAIR     WISDOM TOOTH EXTRACTION  2014   Social History:  reports that she has quit smoking. Her smoking use included cigarettes. She has a 2.00 pack-year smoking history. She has never used smokeless tobacco. She reports previous alcohol use. She reports that she does not use drugs.  No Known Allergies  Family history reviewed and not pertinent Family History  Problem Relation Age of Onset   Alcohol abuse Father    Depression Father    Healthy Mother      Prior to Admission medications   Medication Sig Start Date End Date Taking? Authorizing Provider  acetaminophen (TYLENOL) 500 MG tablet Take 1,000 mg by mouth every 6 (six) hours as needed.   Yes [provider]  metFORMIN (GLUCOPHAGE) 500 MG tablet Take 1 tablet (500 mg total) by mouth 2 (two) times daily with a meal. 09/16/20  Yes  Palumbo, April, MD  ondansetron (ZOFRAN) 4 MG tablet Take 4 mg by mouth every 8 (eight) hours as needed for nausea or vomiting. Patient not taking: No sig reported    [provider]  Prenatal Vit-Fe Fumarate-FA (PRENATAL MULTIVITAMIN) TABS tablet Take 1 tablet by mouth daily at 12 noon. Patient not taking: No sig reported    [provider]    Physical Exam: Vitals:   09/17/20 0500 09/17/20 0600 09/17/20 0700 09/17/20 0919  BP: 114/67 111/73 111/62 120/81  Pulse: 83  89 78 80  Resp: 13 14 14 16   Temp:      TempSrc:      SpO2: 100% 100% 100% 100%  Weight:      Height:        General: alert and oriented to time, place, and person. Appear in mild distress, affect appropriate Eyes: PERRL, Conjunctiva normal left eye ptosis. ENT: Oral Mucosa Clear, moist  Neck: no JVD, no Abnormal Mass Or lumps Cardiovascular: S1 and S2 Present, no Murmur, peripheral pulses symmetrical Respiratory: good respiratory effort, Bilateral Air entry equal and Decreased, no signs of accessory muscle use, Clear to Auscultation, no Crackles, no wheezes Abdomen: Bowel Sound present, Soft and no tenderness, no hernia Skin: no rashes  Extremities: no Pedal edema, no calf tenderness Neurologic: without any new focal findings, mental status, alert and oriented x3, speech normal, PERLA, Motor strength 5/5 and symmetric, Sensation grossly normal to light touch, Reflex difficult to assess, and Finger to nose test normal bilaterally  Gait not checked due to patient safety concerns  Data Reviewed: I have personally reviewed and interpreted labs, imaging as discussed below.  CBC: Recent Labs  Lab 09/16/20 2020 09/16/20 2158  WBC 11.2*  --   NEUTROABS 8.0*  --   HGB 15.8* 13.6  HCT 46.2* 40.0  MCV 82.5  --   PLT 323  --    Basic Metabolic Panel: Recent Labs  Lab 09/16/20 2020 09/16/20 2143 09/16/20 2158 09/17/20 0211 09/17/20 0958 09/17/20 1441  NA 133* 133* 136 134* 136 135  K 4.0 3.9 3.9 3.2* 3.4* 3.8  CL 109 110  --  112* 112* 111  CO2 9* 11*  --  14* 14* 16*  GLUCOSE 229* 205*  --  179* 227* 199*  BUN 7 7  --  5* <5* <5*  CREATININE 0.74 0.58  --  0.55 0.46 0.54  CALCIUM 8.8* 8.0*  --  7.5* 8.2* 8.1*   GFR: Estimated Creatinine Clearance: 98 mL/min (by C-G formula based on SCr of 0.54 mg/dL). Liver Function Tests: No results for input(s): AST, ALT, ALKPHOS, BILITOT, PROT, ALBUMIN in the last 168 hours. No results for input(s): LIPASE, AMYLASE in the last 168  hours. No results for input(s): AMMONIA in the last 168 hours. Coagulation Profile: No results for input(s): INR, PROTIME in the last 168 hours. Cardiac Enzymes: No results for input(s): CKTOTAL, CKMB, CKMBINDEX, TROPONINI in the last 168 hours. BNP (last 3 results) No results for input(s): PROBNP in the last 8760 hours. HbA1C: No results for input(s): HGBA1C in the last 72 hours. CBG: Recent Labs  Lab 09/17/20 0529 09/17/20 0640 09/17/20 0737 09/17/20 1127 09/17/20 1618  GLUCAP 158* 200* 188* 186* 173*   Lipid Profile: No results for input(s): CHOL, HDL, LDLCALC, TRIG, CHOLHDL, LDLDIRECT in the last 72 hours. Thyroid Function Tests: No results for input(s): TSH, T4TOTAL, FREET4, T3FREE, THYROIDAB in the last 72 hours. Anemia Panel: No results for input(s): VITAMINB12, FOLATE, FERRITIN, TIBC,  IRON, RETICCTPCT in the last 72 hours. Urine analysis:    Component Value Date/Time   COLORURINE YELLOW 09/16/2020 2020   APPEARANCEUR HAZY (A) 09/16/2020 2020   LABSPEC >1.030 (H) 09/16/2020 2020   PHURINE 5.5 09/16/2020 2020   GLUCOSEU >=500 (A) 09/16/2020 2020   HGBUR TRACE (A) 09/16/2020 2020   BILIRUBINUR SMALL (A) 09/16/2020 2020   KETONESUR >80 (A) 09/16/2020 2020   PROTEINUR 30 (A) 09/16/2020 2020   UROBILINOGEN 1.0 11/24/2006 1812   NITRITE NEGATIVE 09/16/2020 2020   LEUKOCYTESUR NEGATIVE 09/16/2020 2020    Radiological Exams on Admission: CT ANGIO HEAD NECK W WO CM  Result Date: 09/17/2020 CLINICAL DATA:  Nystagmus or other irregular eye movement. Left eye ptosis, rule out carotid dissection. EXAM: CT ANGIOGRAPHY HEAD AND NECK TECHNIQUE: Multidetector CT imaging of the head and neck was performed using the standard protocol during bolus administration of intravenous contrast. Multiplanar CT image reconstructions and MIPs were obtained to evaluate the vascular anatomy. Carotid stenosis measurements (when applicable) are obtained utilizing NASCET criteria, using the distal  internal carotid diameter as the denominator. CONTRAST:  53mL OMNIPAQUE IOHEXOL 350 MG/ML SOLN COMPARISON:  None. FINDINGS: CT HEAD FINDINGS Brain: No evidence of acute infarction, hemorrhage, hydrocephalus, extra-axial collection or mass lesion/mass effect. Vascular: No hyperdense vessel or unexpected calcification. Skull: Normal. Negative for fracture or focal lesion. Sinuses: Imaged portions are clear. Orbits: No acute finding. Review of the MIP images confirms the above findings CTA NECK FINDINGS Aortic arch: Standard branching. Imaged portion shows no evidence of aneurysm or dissection. No significant stenosis of the major arch vessel origins. Right carotid system: No evidence of dissection, stenosis (50% or greater) or occlusion. Left carotid system: Small dissection flap is noted in the distal left carotid bulb (series 11, image 186; series 12, image 118; series 15, image 25). No flow-limiting stenosis or pseudoaneurysm. Vertebral arteries: Codominant. No evidence of dissection, stenosis (50% or greater) or occlusion. Skeleton: Negative. Other neck: Periapical lucency at the first right mandibular molar tooth. Upper chest: Negative. Review of the MIP images confirms the above findings CTA HEAD FINDINGS Anterior circulation: No significant stenosis, proximal occlusion, or vascular malformation. A 1-2 mm outpouching at the origin of the right posterior communicating artery likely represents an infundibulum, although possibility of a small aneurysm cannot be entirely excluded. Posterior circulation: No significant stenosis, proximal occlusion, aneurysm, or vascular malformation. Venous sinuses: As permitted by contrast timing, patent. Anatomic variants: None significant. Review of the MIP images confirms the above findings IMPRESSION: 1. No acute intracranial abnormality. 2. Small dissection flap at the left carotid bulb without flow-limiting stenosis or pseudoaneurysm. Electronically Signed   By: Baldemar Lenis M.D.   On: 09/17/2020 14:24   MR BRAIN W WO CONTRAST  Result Date: 09/17/2020 CLINICAL DATA:  Provided history: Nystagmus or other irregular eye movement; left eye ptosis isolated. EXAM: MRI HEAD WITHOUT AND WITH CONTRAST TECHNIQUE: Multiplanar, multiecho pulse sequences of the brain and surrounding structures were obtained without and with intravenous contrast. CONTRAST:  107mL GADAVIST GADOBUTROL 1 MMOL/ML IV SOLN COMPARISON:  Noncontrast head CT and CT angiogram head/neck 09/17/2020. FINDINGS: Brain: Cerebral volume is normal. No cortical encephalomalacia is identified. No significant white matter disease. There is no acute infarct. No evidence of an intracranial mass. No chronic intracranial blood products. No extra-axial fluid collection. No midline shift. No abnormal intracranial enhancement. Vascular: Expected proximal arterial flow voids. Skull and upper cervical spine: No focal marrow lesion. Sinuses/Orbits: Visualized orbits show no acute  finding. No significant paranasal sinus disease. Other: Trace right mastoid effusion. IMPRESSION: Unremarkable MRI appearance of the brain. No evidence of acute intracranial abnormality. Trace right mastoid effusion. Electronically Signed   By: Jackey Loge DO   On: 09/17/2020 18:03   DG ABD ACUTE 2+V W 1V CHEST  Result Date: 09/17/2020 CLINICAL DATA:  Nausea vomiting fatigue.  COVID in May. EXAM: DG ABDOMEN ACUTE WITH 1 VIEW CHEST COMPARISON:  Chest two-view 04/14/2006 FINDINGS: Lungs are clear without infiltrate or effusion. Cardiac and mediastinal contours normal. Normal bowel gas pattern. Moderate stool in the colon. Contrast in the renal collecting system and bladder bilaterally without obstruction or abnormality. CT angio performed earlier today. No skeletal abnormality. IMPRESSION: No active cardiopulmonary disease Normal bowel gas pattern.  Moderate stool in the colon. Electronically Signed   By: Marlan Palau M.D.   On: 09/17/2020 14:44     I reviewed all nursing notes, pharmacy notes, vitals, pertinent old records.  Assessment/Plan 1.  Diabetic ketoacidosis. Starvation ketoacidosis from and ketogenic diet. New onset diabetes Patient presents with complaints of nausea and vomiting. Recently seen with polydipsia and polyuria in the ER and was recommended to be on metformin. Has history of gestational diabetes. On presentation has mild hyperglycemia with normal anion gap but significantly elevated beta hydroxybutyric acid and pH less than 7.2. Meeting criteria for diabetic ketoacidosis. With her history of ketogenic diet patient may also have starvation ketoacidosis. At present patient does not appear to be toxic does not have any nausea or vomiting. Based on the clinical appearance after discussion with the diabetes coordinator we will advance the diet to carb modified diet. Already received insulin Lantus earlier at 4 AM.  Will provide insulin Lantus 16 units. Continue every 4 hours sliding scale insulin instead of ACHS scale for today. Hemoglobin A1c currently pending. I will also add short-acting Premeal coverage based on the discussion with the diabetic coordinator given the concern for type 1 diabetes. Patient will require further work-up for differentiation between type I and type 2 diabetes.  2.  Left carotid dissection. Left eye ptosis. Almost 67 week old presentation based on her photographs. CTA head and neck was performed shows left carotid artery dissection at the bulb without any clot. Neurology was consulted.  Initial plan was to transfer to [facility]. Given that there is no significant clot and the symptoms have been more than 21 week old patient was recommended to remain at [facility]. Continue to monitor on telemetry. With follow-up urology recommendation.  Likely will require dual antiplatelet therapy. MRI brain also pending. I have not ordered complete stroke work-up given lack of  other symptoms and discussion with neurology.  3.  COVID-19 infection. Diagnosed on 5/16.  Beyond isolation requirement. Did not receive any therapy although at present does not appear to be requiring any therapy.  Not a candidate for Monocal antibody or oral tablets given the timeline.  4.  Obesity. Placing the patient at high risk of poor outcome. Will monitor.  5.  Hypokalemia. Being replaced. Monitor.  Nutrition: Carb modified diet DVT Prophylaxis: Subcutaneous Lovenox  Advance goals of care discussion: Full code   Consults: I personally Discussed with neurology  Family Communication: family was present at bedside, at the time of interview.  Opportunity was given to ask question and all questions were answered satisfactorily.   Disposition:  From: Home Likely will need Home on discharge.   Author: Lynden Oxford, MD [facility] 09/17/2020 7:56 PM  To reach On-call, see care teams to locate the attending and reach out to them via [email or web address]. If 7PM-7AM, please contact night-coverage If you still have difficulty reaching the attending provider, please page the [facility] (Director on Call) for [facility] on amion for assistance.

## 2020-09-17 NOTE — ED Notes (Signed)
Checked CBG 149, RN Alfred informed

## 2020-09-17 NOTE — ED Notes (Signed)
PT. RESTING WITH EYES CLOSED AND IS EASILY AWAKENED WITH NO SIGNS OR SYMPTOMS OF DISTRESS.  PT.  HAS NO COMPLAINTS AT PRESENT TIME.

## 2020-09-17 NOTE — Consult Note (Addendum)
Neurology Consultation  Reason for Consult: Left eye ptosis, small dissection flap at the left carotid bulb identified on CT angio imaging Referring Physician: Dr. Allena Katz  CC: Nausea, vomiting, and GI upset following Metformin initiation  History is obtained from: Patient, Chart review  HPI: Mackenzie Mcgee is a 40 y.o. female with a medical history significant for obesity and gestational diabetes who presented to [facility] 09/16/2020 for evaluation of nausea, vomiting, polydipsia, polyphagia, and polyuria with generalized fatigue following metformin initiation on 09/15/2020. While in the ED, providers noted an isolated left eye ptosis that was not previously noted by the patient. She was able to look into her phone at previous pictures and note that the ptosis was first noticeable approximately one week ago. She does state that the ptosis has progressed since onset and gets worse throughout the day with eye fatigue. CT angio imaging was completed with evidence of a small dissection flap at the left carotid bulb without flow-limiting stenosis or pseudoaneurysm and neurology was consulted for further evaluation and management. She denies anhidrosis and there is no miosis identifiable on initial examination but she has complained of dizziness with activity over the past week. She denies any associated visual changes.   Father bedside confirms that that the ptosis is not longstanding.  ROS: A complete ROS was performed and is negative except as noted in the HPI.   Past Medical History:  Diagnosis Date   COVID-19 virus infection 07/2020   Gestational diabetes    Morbid obesity (HCC)    Pregnancy    Past Surgical History:  Procedure Laterality Date   HERNIA REPAIR     WISDOM TOOTH EXTRACTION  2014   Family History  Problem Relation Age of Onset   Alcohol abuse Father    Depression Father    Healthy Mother   -Additionally notable for psoriasis and a maternal cousin and Graves' disease in a  different maternal cousin   Social History:   reports that she has quit smoking. Her smoking use included cigarettes. She has a 2.00 pack-year smoking history. She has never used smokeless tobacco. She reports previous alcohol use. She reports that she does not use drugs.  Medications  Current Facility-Administered Medications:    acetaminophen (TYLENOL) tablet 650 mg, 650 mg, Oral, Q6H PRN, Rolly Salter, MD   ascorbic acid (VITAMIN C) tablet 500 mg, 500 mg, Oral, Daily, Lynden Oxford M, MD, 500 mg at 09/17/20 1151   dextrose 50 % solution 0-50 mL, 0-50 mL, Intravenous, PRN, Rolly Salter, MD   enoxaparin (LOVENOX) injection 40 mg, 40 mg, Subcutaneous, Q24H, Patel, Zachery Conch, MD   insulin aspart (novoLOG) injection 0-15 Units, 0-15 Units, Subcutaneous, Q4H, Rolly Salter, MD, 3 Units at 09/17/20 1730   insulin aspart (novoLOG) injection 3 Units, 3 Units, Subcutaneous, TID WC, Rolly Salter, MD, 3 Units at 09/17/20 1730   insulin glargine (LANTUS) injection 16 Units, 16 Units, Subcutaneous, Q24H, Rolly Salter, MD, 16 Units at 09/17/20 1441   lactated ringers infusion, , Intravenous, Continuous, Rolly Salter, MD, Last Rate: 125 mL/hr at 09/17/20 1732, New Bag at 09/17/20 1732   ondansetron (ZOFRAN) injection 4 mg, 4 mg, Intravenous, Q6H PRN, Rolly Salter, MD   zinc sulfate capsule 220 mg, 220 mg, Oral, Daily, Rolly Salter, MD, 220 mg at 09/17/20 1151  Exam: Current vital signs: BP 120/81 (BP Location: Left Arm)   Pulse 80   Temp 97.9 F (36.6 C) (Oral)   Resp  16   Ht 5\' 4"  (1.626 m)   Wt 80.7 kg   SpO2 100%   BMI 30.55 kg/m  Vital signs in last 24 hours: Temp:  [97.9 F (36.6 C)] 97.9 F (36.6 C) (06/27 1857) Pulse Rate:  [77-108] 80 (06/28 0919) Resp:  [13-22] 16 (06/28 0919) BP: (97-135)/(49-81) 120/81 (06/28 0919) SpO2:  [97 %-100 %] 100 % (06/28 0919) Weight:  [80.7 kg] 80.7 kg (06/27 1858)  GENERAL: Awake, alert, sitting up in bed, in no acute  distress Psych: Affect appropriate for situation, calm and cooperative with examination Head: Normocephalic and atraumatic, without obvious abnormality EENT: left conjunctivae erythema noted, no OP obstruction LUNGS: Normal respiratory effort. Non-labored breathing CV: Regular rate on telemetry ABDOMEN - Soft, non-distended Ext: warm, well perfused, without obvious deformity  NEURO:  Mental Status: Awake, alert, and oriented to person, place, time, and situation.  She is able to provide a clear and coherent history of present illness.  Speech/Language: speech is intact without dysarthria or aphasia.   Naming, repetition, fluency, and comprehension intact. No neglect noted. Cranial Nerves:  II: PERRL 5 mm/brisk, symmetric. Visual fields full.  III, IV, VI: EOMI with ptosis on the left eye.  V: Sensation is intact to light touch and symmetrical to face.   VII: Face is symmetric resting and smiling.   VIII: Hearing is intact to voice IX, X: Palate elevation is symmetric. Phonation normal.  XI: Normal sternocleidomastoid and trapezius muscle strength XII: Tongue protrudes midline without fasciculations.   Motor: 5/5 strength present throughout upper and lower extremities without vertical drift on assessment. Moves all extremities well with spontaneous antigravity movement.  Tone is normal. Bulk is normal.  Sensation: Intact to light touch bilaterally in all four extremities. No extinction to DSS present.  Coordination: FTN intact bilaterally. HKS intact bilaterally. DTRs: 2+ and symmetric biceps and brachioradialis Gait: Deferred  Myasthenia specific testing: No arm fatigue or increased ptosis with 2 minutes of sustained upgaze on attending examination Single breath Test 64  Labs I have reviewed labs in epic and the results pertinent to this consultation are: CBC    Component Value Date/Time   WBC 11.2 (H) 09/16/2020 2020   RBC 5.60 (H) 09/16/2020 2020   HGB 13.6 09/16/2020 2158    HCT 40.0 09/16/2020 2158   PLT 323 09/16/2020 2020   MCV 82.5 09/16/2020 2020   MCH 28.2 09/16/2020 2020   MCHC 34.2 09/16/2020 2020   RDW 13.4 09/16/2020 2020   LYMPHSABS 1.9 09/16/2020 2020   MONOABS 1.1 (H) 09/16/2020 2020   EOSABS 0.1 09/16/2020 2020   BASOSABS 0.1 09/16/2020 2020   CMP     Component Value Date/Time   NA 135 09/17/2020 1441   K 3.8 09/17/2020 1441   CL 111 09/17/2020 1441   CO2 16 (L) 09/17/2020 1441   GLUCOSE 199 (H) 09/17/2020 1441   BUN <5 (L) 09/17/2020 1441   CREATININE 0.54 09/17/2020 1441   CALCIUM 8.1 (L) 09/17/2020 1441   PROT 5.7 (L) 02/24/2017 0213   ALBUMIN 2.9 (L) 02/24/2017 0213   AST 17 02/24/2017 0213   ALT 10 (L) 02/24/2017 0213   ALKPHOS 103 02/24/2017 0213   BILITOT 0.4 02/24/2017 0213   GFRNONAA >60 09/17/2020 1441   GFRAA >60 02/24/2017 0213   Lipid Panel  No results found for: CHOL, TRIG, HDL, CHOLHDL, VLDL, LDLCALC, LDLDIRECT No results found for: HGBA1C  Imaging I have reviewed the images obtained:  CT angio head and neck 09/17/2020: 1.  No acute intracranial abnormality. 2. Small dissection flap at the left carotid bulb without flow-limiting stenosis or pseudoaneurysm.  MRI examination of the brain 09/17/2020: Unremarkable MRI appearance of the brain. No evidence of acute intracranial abnormality. Trace right mastoid effusion.  Assessment: 40 y.o. female who presented to [facility] for evaluation of GI upset after starting metformin 09/15/2020 and was found to have a left eye ptosis on examination. CT angio imaging revealed a small dissection flap at the left carotid bulb without flow-limiting stenosis or pseudoaneurysm. - Examination reveals left eye ptosis that is worse with eye fatigue on NP evaluation, not noticeably worsening on attending evaluation performed just after the patient awoke, but without miosis or visual changes. Pupils are equal, round, and briskly reactive to light bilaterally, 5 mm and brisk.  - CT imaging  with small dissection flap at the left carotid bulb without flow-limiting stenosis or pseudoaneurysm. MRI brain imaging completed and is unremarkable without evidence of acute intracranial abnormality.   Impression: Left carotid dissection- small dissection flap at the left carotid bulb without flow-limiting stenosis or pseudoaneurysm Left eye ptosis   While carotid dissection can be associated with a Horner syndrome, the only symptom she has is ptosis.  It seems highly unlikely but formally possible that given her small dissection of the left carotid bulb she affected only in the nerves affecting levator palpebrae and not the nerves providing dilatory control of the pupil or sweat control of the face.  However, ptosis can also be associated with neuromuscular disorder such as myasthenia gravis though fortunately she does not seem to have any other symptoms of this.  Fortunately, aneurysm has been excluded by imaging and do not feel benefit as stroke is likely given her negative imaging and duration of symptoms.  Microvascular damage to the 3rd cranial nerve could also be playing a role especially in the setting of her uncontrolled diabetes, and in the setting of the treatment with the blood pressure control and supportive care.  Etiology may also end up being idiopathic  Recommendations: -Acetylcholine receptor binding antibody level, to be followed up by outpatient neurologist -Aspirin 81 mg daily, Plavix 300 mg loading dose followed by 75 mg daily, will plan for a 31-month course or until instructed to stop by outpatient neurologist -Outpatient referral to [facility]  -Return precautions discussed with the patient including additional cranial nerve deficits, worsening shortness of breath or single breath test count, worsening weakness -Neurology available as needed going forwards, please reach out if new questions arise  Pt seen by NP/Neuro and later by MD. Note/plan to be edited  by MD as needed.  Lanae Boast, AGAC-NP [facility] Pager: [phone]  Attending Neurologist's note:  I personally saw this patient, gathering history, performing a full neurologic examination, reviewing relevant labs, personally reviewing relevant imaging including CTA head/neck and MRI brain, and formulated the assessment and plan, adding the note above for completeness and clarity to accurately reflect my thoughts.  Brooke Dare MD-PhD [facility] [phone]  Available 7 PM to 7 AM, outside of these hours please call Neurologist on call as listed on Amion.

## 2020-09-18 DIAGNOSIS — E872 Acidosis: Secondary | ICD-10-CM

## 2020-09-18 DIAGNOSIS — E876 Hypokalemia: Secondary | ICD-10-CM

## 2020-09-18 LAB — BASIC METABOLIC PANEL
Anion gap: 4 — ABNORMAL LOW (ref 5–15)
Anion gap: 3 — ABNORMAL LOW (ref 5–15)
BUN: 6 mg/dL (ref 6–20)
BUN: 5 mg/dL — ABNORMAL LOW (ref 6–20)
CO2: 22 mmol/L (ref 22–32)
CO2: 23 mmol/L (ref 22–32)
Calcium: 8.2 mg/dL — ABNORMAL LOW (ref 8.9–10.3)
Calcium: 8.3 mg/dL — ABNORMAL LOW (ref 8.9–10.3)
Chloride: 112 mmol/L — ABNORMAL HIGH (ref 98–111)
Chloride: 110 mmol/L (ref 98–111)
Creatinine, Ser: 0.45 mg/dL (ref 0.44–1.00)
Creatinine, Ser: 0.33 mg/dL — ABNORMAL LOW (ref 0.44–1.00)
GFR, Estimated: >60 mL/min (ref >60)
GFR, Estimated: >60 mL/min (ref >60)
Glucose, Bld: 138 mg/dL — ABNORMAL HIGH (ref 70–99)
Glucose, Bld: 115 mg/dL — ABNORMAL HIGH (ref 70–99)
Potassium: 2.9 mmol/L — ABNORMAL LOW (ref 3.5–5.1)
Potassium: 2.9 mmol/L — ABNORMAL LOW (ref 3.5–5.1)
Sodium: 138 mmol/L (ref 135–145)
Sodium: 136 mmol/L (ref 135–145)

## 2020-09-18 LAB — BETA-HYDROXYBUTYRIC ACID
Beta-Hydroxybutyric Acid: 1.59 mmol/L — ABNORMAL HIGH (ref 0.05–0.27)
Beta-Hydroxybutyric Acid: 1.35 mmol/L — ABNORMAL HIGH (ref 0.05–0.27)

## 2020-09-18 LAB — GLUCOSE, CAPILLARY
Glucose-Capillary: 119 mg/dL — ABNORMAL HIGH (ref 70–99)
Glucose-Capillary: 154 mg/dL — ABNORMAL HIGH (ref 70–99)
Glucose-Capillary: 166 mg/dL — ABNORMAL HIGH (ref 70–99)
Glucose-Capillary: 94 mg/dL (ref 70–99)

## 2020-09-18 LAB — HEMOGLOBIN A1C
Hgb A1c MFr Bld: 11 % — ABNORMAL HIGH (ref 4.8–5.6)
Mean Plasma Glucose: 269 mg/dL

## 2020-09-18 LAB — HIV ANTIBODY (ROUTINE TESTING W REFLEX): HIV Screen 4th Generation wRfx: NONREACTIVE

## 2020-09-18 MED ORDER — POTASSIUM CHLORIDE CRYS ER 20 MEQ PO TBCR
40.0000 meq | EXTENDED_RELEASE_TABLET | ORAL | Status: AC
  Administered 2020-09-18 (×2): 40 meq via ORAL
  Filled 2020-09-18 (×2): qty 2

## 2020-09-18 MED ORDER — INSULIN STARTER KIT- PEN NEEDLES (ENGLISH)
1.0000 | Freq: Once | Status: DC
  Filled 2020-09-18: qty 1

## 2020-09-18 MED ORDER — ASPIRIN 81 MG PO TBEC: 81.0000 mg | DELAYED_RELEASE_TABLET | Freq: Every day | ORAL | 11 refills | Status: DC

## 2020-09-18 MED ORDER — ASCORBIC ACID 500 MG PO TABS: 500.0000 mg | ORAL_TABLET | Freq: Every day | ORAL | 0 refills | Status: DC

## 2020-09-18 MED ORDER — PEN NEEDLES 32G X 4 MM MISC: 15.0000 [IU] | Freq: Every day | 1 refills | Status: DC

## 2020-09-18 MED ORDER — ZINC SULFATE 220 (50 ZN) MG PO CAPS: 220.0000 mg | ORAL_CAPSULE | Freq: Every day | ORAL | 0 refills | Status: DC

## 2020-09-18 MED ORDER — CLOPIDOGREL BISULFATE 75 MG PO TABS: 75.0000 mg | ORAL_TABLET | Freq: Every day | ORAL | 0 refills | Status: DC

## 2020-09-18 MED ORDER — CLOPIDOGREL BISULFATE 75 MG PO TABS
300.0000 mg | ORAL_TABLET | Freq: Once | ORAL | Status: AC
  Administered 2020-09-18: 300 mg via ORAL
  Filled 2020-09-18: qty 4

## 2020-09-18 MED ORDER — ASPIRIN EC 81 MG PO TBEC
81.0000 mg | DELAYED_RELEASE_TABLET | Freq: Every day | ORAL | Status: DC
  Administered 2020-09-18: 81 mg via ORAL
  Filled 2020-09-18: qty 1

## 2020-09-18 MED ORDER — BLOOD GLUCOSE MONITOR KIT: PACK | 0 refills | Status: DC

## 2020-09-18 MED ORDER — INSULIN GLARGINE 100 UNIT/ML SOLOSTAR PEN: 15.0000 [IU] | PEN_INJECTOR | Freq: Every day | SUBCUTANEOUS | 11 refills | Status: DC

## 2020-09-18 MED ORDER — SEMAGLUTIDE 3 MG PO TABS: 3.0000 mg | ORAL_TABLET | Freq: Every day | ORAL | 1 refills | Status: DC

## 2020-09-18 MED ORDER — CLOPIDOGREL BISULFATE 75 MG PO TABS: 75.0000 mg | ORAL_TABLET | Freq: Every day | ORAL | Status: DC

## 2020-09-18 NOTE — Progress Notes (Signed)
PT Cancellation Note  Patient Details Name: Mackenzie Mcgee MRN: [id number] DOB: 10-18-1980   Cancelled Treatment:    Reason Eval/Treat Not Completed: PT screened, no needs identified, will sign off   Rada Hay 09/18/2020, 12:11 PM Blanchard Kelch PT [facility] Pager [phone] Office [phone]

## 2020-09-18 NOTE — Progress Notes (Signed)
Inpatient Diabetes Program Recommendations  AACE/ADA: New Consensus Statement on Inpatient Glycemic Control (2015)  Target Ranges:  Prepandial:   less than 140 mg/dL      Peak postprandial:   less than 180 mg/dL (1-2 hours)      Critically ill patients:  140 - 180 mg/dL   Lab Results  Component Value Date   GLUCAP 154 (H) 09/18/2020   HGBA1C 11.0 (H) 09/17/2020    Review of Glycemic Control  Diabetes history: GDM, prediabetes Outpatient Diabetes medications: metformin 500 mg BID (not taking) Current orders for Inpatient glycemic control: Lantus 16 units Q24H, Novolog 0-15 units Q4H + 3 units TID  HgbA1C - 11.0%  Inpatient Diabetes Program Recommendations:    Change Novolog to 0-15 TID with meals and 0-5 HS  Spoke with pt on 6/28 about new diagnosis of DM. Will need to go home on insulin. Did not have HgbA1C results yesterday. Will see around lunchtime today and teach insulin pen administration and discuss HgbA1C of 11%. Pt states she's had a lot of DM education when she had GDM. She was only on metformin at the time. Pt states she exercises and eat fairly healthy prior to getting Covid in May 2022. Also said she does intermittent fasting from 11am-5 pm several days/week, and had lost a significant amount of weight.   Will teach insulin pen administration today. Order starter kit.  Thank you. Lorenda Peck, RD, LDN, CDE Inpatient Diabetes Coordinator [phone]

## 2020-09-18 NOTE — Progress Notes (Signed)
OT Cancellation Note  Patient Details Name: Mackenzie Mcgee MRN: [id number] DOB: 17-Jun-1980   Cancelled Treatment:    Reason Eval/Treat Not Completed: OT screened, no needs identified, will sign off. Reports no physical deficits. Ambulating and toileting independently in room. No OT needs at this time.  Kutler Vanvranken L Nihal Doan 09/18/2020, 10:10 AM

## 2020-09-18 NOTE — Progress Notes (Signed)
Inpatient Diabetes Program Recommendations  AACE/ADA: New Consensus Statement on Inpatient Glycemic Control (2015)  Target Ranges:  Prepandial:   less than 140 mg/dL      Peak postprandial:   less than 180 mg/dL (1-2 hours)      Critically ill patients:  140 - 180 mg/dL   Lab Results  Component Value Date   GLUCAP 94 09/18/2020   HGBA1C 11.0 (H) 09/17/2020    Review of Glycemic Control  CBGs: 119, 154, 115, 94 mg/dL To be d/ced on: Lantus 15 units QD Semaglutide 3 mg QD  Educated patient on insulin pen use at home.  Reviewed all steps if insulin pen including attachment of needle, 2-unit air shot, dialing up dose, giving injection, removing needle, disposal of sharps, storage of unused insulin, disposal of insulin etc. Patient able to provide successful return demonstration. Also reviewed troubleshooting with insulin pen. MD to give patient Rxs for insulin pens and insulin pen needles. Sent insulin pen administration video to pt.  Reviewed diet, portion control, monitoring, hypoglycemia s/s and treatment. Answered questions.  F/U in 1 week with PCP  Thank you. Ailene Ards, RD, LDN, CDE Inpatient Diabetes Coordinator [phone]

## 2020-09-18 NOTE — Discharge Summary (Signed)
Physician Discharge Summary  Mackenzie Mcgee PPJ:[id number] DOB: [birth year] DOA: 09/16/2020  PCP: System, Provider Not In  Admit date: 09/16/2020 Discharge date: 09/18/2020  Admitted From: Home Disposition: Home  Recommendations for Outpatient Follow-up:  Follow up with PCP in 1-2 weeks Please obtain BMP/CBC in one week Please follow up on the following pending results: None  Home Health: No Equipment/Devices: None Discharge Condition: Stable CODE STATUS: Full Diet recommendation: Heart Healthy / Carb Modified   Brief/Interim Summary: Mackenzie Mcgee is a 40 y.o. female with Past medical history of recent COVID-19 infection, gestational diabetes, morbid obesity. Patient presented with complaints of nausea and vomiting. Patient was only seen in the ER on 6/27 for polydipsia and polyuria.  Found to have hyperglycemia and suspected to have diabetes and was prescribed metformin and sent home. After taking metformin at home she started having nausea and vomiting and therefore Decided to come to the hospital. Patient also has an history of testing positive for COVID-19 on 5/16.  She was recommended monoclonal antibody but she never received it due to financial reasons.  Has not received any treatment for COVID and recovered completely. She was also doing ketogenic diet and intermittent fasting for weight loss.  She was found to have metabolic acidosis without any anion gap and elevated beta hydroxybutyric acid.  Elevated blood glucose level in low 300s, she does not meet the criteria for diabetic ketoacidosis.  I believe that metabolic acidosis was secondary to dehydration, ketogenic diet and intermittent fasting. She was initially started on Endo tool and responded pretty quickly.  Later transitioned to basal and short-acting insulin.  A1c came back at 11.  No prior diagnosis of diabetes except gestational diabetes in the past.  Patient recently moved and has not get established with  any primary care provider yet.  Patient was treated for hyperglycemia and new diagnosis of diabetes, discharged on Lantus and semaglutide as she was also interested in weight loss.  She will follow-up with primary care doctor for further management.  Patient was also found to have left eyelid ptosis which she was going on for about 1 week.  CTA head and neck was positive for left carotid artery dissection at the bulb without any clot.  Neurology was consulted.  MRI brain was without any acute abnormality.  They were recommending DAPT with aspirin and Plavix for 42-monthand outpatient neurology follow-up for further recommendations.  Patient was given extensive diabetic education and will follow up with her primary care provider for further recommendations.  Discharge Diagnoses:  Principal Problem:   DKA (diabetic ketoacidosis) ([facility] Active Problems:   COVID-19 virus infection   Ketoacidosis   Hypokalemia   Obesity (BMI 30-39.9)   Ptosis of left eyelid   Dissection of left carotid artery ([facility]  Discharge Instructions  Discharge Instructions     Diet - low sodium heart healthy   Complete by: As directed    Diet Carb Modified   Complete by: As directed    Discharge instructions   Complete by: As directed    It was pleasure taking care of you. You are being started on a new medicine called semaglutide for your diabetes, it will also help you reduce your weight. You will start with 3 mg daily for 30 days and your primary care doctor can increase the dose to 7 mg daily if needed after that. You are also being given long-acting insulin which you will use once a day. Keep checking your blood glucose level 3-4  times a day and take your glucometer to your doctors appointment so they can adjust your medications accordingly. For your eye problem and dissection of blood vessel, your neurologist advised to take aspirin and Plavix together for 26-monthand then just take aspirin.  These 2  medications increase the chance of bleeding, if you experience any abnormal bleeding please contact your medical doctor. Follow-up with neurology as an outpatient for further recommendations.   Increase activity slowly   Complete by: As directed       Allergies as of 09/18/2020   No Known Allergies      Medication List     STOP taking these medications    metFORMIN 500 MG tablet Commonly known as: GLUCOPHAGE   ondansetron 4 MG tablet Commonly known as: ZOFRAN   prenatal multivitamin Tabs tablet       TAKE these medications    acetaminophen 500 MG tablet Commonly known as: TYLENOL Take 1,000 mg by mouth every 6 (six) hours as needed.   ascorbic acid 500 MG tablet Commonly known as: VITAMIN C Take 1 tablet (500 mg total) by mouth daily. Start taking on: September 19, 2020   aspirin 81 MG EC tablet Take 1 tablet (81 mg total) by mouth daily. Swallow whole. Start taking on: September 19, 2020   blood glucose meter kit and supplies Kit Dispense based on patient and insurance preference. Use up to four times daily as directed.   clopidogrel 75 MG tablet Commonly known as: PLAVIX Take 1 tablet (75 mg total) by mouth daily. Start taking on: September 19, 2020   insulin glargine 100 UNIT/ML Solostar Pen Commonly known as: LANTUS Inject 15 Units into the skin daily.   Pen Needles 32G X 4 MM Misc 15 Units by Does not apply route daily.   Semaglutide 3 MG Tabs Take 3 mg by mouth daily.   zinc sulfate 220 (50 Zn) MG capsule Take 1 capsule (220 mg total) by mouth daily. Start taking on: September 19, 2020        No Known Allergies  Consultations: None  Procedures/Studies: CT ANGIO HEAD NECK W WO CM  Result Date: 09/17/2020 CLINICAL DATA:  Nystagmus or other irregular eye movement. Left eye ptosis, rule out carotid dissection. EXAM: CT ANGIOGRAPHY HEAD AND NECK TECHNIQUE: Multidetector CT imaging of the head and neck was performed using the standard protocol during bolus  administration of intravenous contrast. Multiplanar CT image reconstructions and MIPs were obtained to evaluate the vascular anatomy. Carotid stenosis measurements (when applicable) are obtained utilizing NASCET criteria, using the distal internal carotid diameter as the denominator. CONTRAST:  777mOMNIPAQUE IOHEXOL 350 MG/ML SOLN COMPARISON:  None. FINDINGS: CT HEAD FINDINGS Brain: No evidence of acute infarction, hemorrhage, hydrocephalus, extra-axial collection or mass lesion/mass effect. Vascular: No hyperdense vessel or unexpected calcification. Skull: Normal. Negative for fracture or focal lesion. Sinuses: Imaged portions are clear. Orbits: No acute finding. Review of the MIP images confirms the above findings CTA NECK FINDINGS Aortic arch: Standard branching. Imaged portion shows no evidence of aneurysm or dissection. No significant stenosis of the major arch vessel origins. Right carotid system: No evidence of dissection, stenosis (50% or greater) or occlusion. Left carotid system: Small dissection flap is noted in the distal left carotid bulb (series 11, image 186; series 12, image 118; series 15, image 25). No flow-limiting stenosis or pseudoaneurysm. Vertebral arteries: Codominant. No evidence of dissection, stenosis (50% or greater) or occlusion. Skeleton: Negative. Other neck: Periapical lucency at the first right  mandibular molar tooth. Upper chest: Negative. Review of the MIP images confirms the above findings CTA HEAD FINDINGS Anterior circulation: No significant stenosis, proximal occlusion, or vascular malformation. A 1-2 mm outpouching at the origin of the right posterior communicating artery likely represents an infundibulum, although possibility of a small aneurysm cannot be entirely excluded. Posterior circulation: No significant stenosis, proximal occlusion, aneurysm, or vascular malformation. Venous sinuses: As permitted by contrast timing, patent. Anatomic variants: None significant. Review  of the MIP images confirms the above findings IMPRESSION: 1. No acute intracranial abnormality. 2. Small dissection flap at the left carotid bulb without flow-limiting stenosis or pseudoaneurysm. Electronically Signed   By: Pedro Earls M.D.   On: 09/17/2020 14:24   MR BRAIN W WO CONTRAST  Result Date: 09/17/2020 CLINICAL DATA:  Provided history: Nystagmus or other irregular eye movement; left eye ptosis isolated. EXAM: MRI HEAD WITHOUT AND WITH CONTRAST TECHNIQUE: Multiplanar, multiecho pulse sequences of the brain and surrounding structures were obtained without and with intravenous contrast. CONTRAST:  22m GADAVIST GADOBUTROL 1 MMOL/ML IV SOLN COMPARISON:  Noncontrast head CT and CT angiogram head/neck 09/17/2020. FINDINGS: Brain: Cerebral volume is normal. No cortical encephalomalacia is identified. No significant white matter disease. There is no acute infarct. No evidence of an intracranial mass. No chronic intracranial blood products. No extra-axial fluid collection. No midline shift. No abnormal intracranial enhancement. Vascular: Expected proximal arterial flow voids. Skull and upper cervical spine: No focal marrow lesion. Sinuses/Orbits: Visualized orbits show no acute finding. No significant paranasal sinus disease. Other: Trace right mastoid effusion. IMPRESSION: Unremarkable MRI appearance of the brain. No evidence of acute intracranial abnormality. Trace right mastoid effusion. Electronically Signed   By: KKellie SimmeringDO   On: 09/17/2020 18:03   DG ABD ACUTE 2+V W 1V CHEST  Result Date: 09/17/2020 CLINICAL DATA:  Nausea vomiting fatigue.  COVID in May. EXAM: DG ABDOMEN ACUTE WITH 1 VIEW CHEST COMPARISON:  Chest two-view 04/14/2006 FINDINGS: Lungs are clear without infiltrate or effusion. Cardiac and mediastinal contours normal. Normal bowel gas pattern. Moderate stool in the colon. Contrast in the renal collecting system and bladder bilaterally without obstruction or  abnormality. CT angio performed earlier today. No skeletal abnormality. IMPRESSION: No active cardiopulmonary disease Normal bowel gas pattern.  Moderate stool in the colon. Electronically Signed   By: CFranchot GalloM.D.   On: 09/17/2020 14:44    Subjective: Patient was seen and examined today.  No new complaint.  No more nausea and vomiting, tolerating diet well.  History of gestational diabetes was currently using keto diet for weight loss along with intermittent fasting.  Unable to tolerate metformin due to nausea and vomiting. Husband at bedside.  Discharge Exam: Vitals:   09/17/20 2114 09/18/20 0601  BP: (!) 110/55 (!) 107/59  Pulse: 85 73  Resp: 17 18  Temp: 98.4 F (36.9 C) 98.2 F (36.8 C)  SpO2: 100% 100%   Vitals:   09/17/20 0700 09/17/20 0919 09/17/20 2114 09/18/20 0601  BP: 111/62 120/81 (!) 110/55 (!) 107/59  Pulse: 78 80 85 73  Resp: _0 Temp:   98.4 F (36.9 C) 98.2 F (36.8 C)  TempSrc:   Oral Oral  SpO2: 100% 100% 100% 100%  Weight:      Height:        General: Pt is alert, awake, not in acute distress Cardiovascular: RRR, S1/S2 +, no rubs, no gallops Respiratory: CTA bilaterally, no wheezing, no rhonchi Abdominal: Soft, NT, ND,  bowel sounds + Extremities: no edema, no cyanosis   The results of significant diagnostics from this hospitalization (including imaging, microbiology, ancillary and laboratory) are listed below for reference.    Microbiology: Recent Results (from the past 240 hour(s))  Resp Panel by RT-PCR (Flu A&B, Covid) Nasopharyngeal Swab     Status: Abnormal   Collection Time: 09/16/20  9:24 PM   Specimen: Nasopharyngeal Swab; Nasopharyngeal(NP) swabs in vial transport medium  Result Value Ref Range Status   SARS Coronavirus 2 by RT PCR POSITIVE (A) NEGATIVE Final    Comment: RESULT CALLED TO, READ BACK BY AND VERIFIED WITH: Jennye Boroughs, RN AT 1213 ON [id number] BY BOWLBY, J (NOTE) SARS-CoV-2 target nucleic acids are  DETECTED.  The SARS-CoV-2 RNA is generally detectable in upper respiratory specimens during the acute phase of infection. Positive results are indicative of the presence of the identified virus, but do not rule out bacterial infection or co-infection with other pathogens not detected by the test. Clinical correlation with patient history and other diagnostic information is necessary to determine patient infection status. The expected result is Negative.  Fact Sheet for Patients: EntrepreneurPulse.com.au  Fact Sheet for Healthcare Providers: IncredibleEmployment.be  This test is not yet approved or cleared by the [place] FDA and  has been authorized for detection and/or diagnosis of SARS-CoV-2 by FDA under an Emergency Use Authorization (EUA).  This EUA will remain in effect (meaning thi s test can be used) for the duration of  the COVID-19 declaration under Section 564(b)(1) of the Act, 21 U.S.C. section 360bbb-3(b)(1), unless the authorization is terminated or revoked sooner.     Influenza A by PCR NEGATIVE NEGATIVE Final   Influenza B by PCR NEGATIVE NEGATIVE Final    Comment: (NOTE) The Xpert Xpress SARS-CoV-2/FLU/RSV plus assay is intended as an aid in the diagnosis of influenza from Nasopharyngeal swab specimens and should not be used as a sole basis for treatment. Nasal washings and aspirates are unacceptable for Xpert Xpress SARS-CoV-2/FLU/RSV testing.  Fact Sheet for Patients: EntrepreneurPulse.com.au  Fact Sheet for Healthcare Providers: IncredibleEmployment.be  This test is not yet approved or cleared by the [place] FDA and has been authorized for detection and/or diagnosis of SARS-CoV-2 by FDA under an Emergency Use Authorization (EUA). This EUA will remain in effect (meaning this test can be used) for the duration of the COVID-19 declaration under Section 564(b)(1) of the Act, 21  U.S.C. section 360bbb-3(b)(1), unless the authorization is terminated or revoked.  Performed at [facility], [address] [place], [place] [address]      Labs: BNP (last 3 results) No results for input(s): BNP in the last 8760 hours. Basic Metabolic Panel: Recent Labs  Lab 09/17/20 0958 09/17/20 1441 09/17/20 2228 09/18/20 0410 09/18/20 1053  NA 136 135 137 138 136  K 3.4* 3.8 3.5 2.9* 2.9*  CL 112* 111 111 112* 110  CO2 14* 16* 17* 22 23  GLUCOSE 227* 199* 218* 138* 115*  BUN <5* <5* 7 6 5*  CREATININE 0.46 0.54 0.56 0.45 0.33*  CALCIUM 8.2* 8.1* 8.5* 8.2* 8.3*   Liver Function Tests: No results for input(s): AST, ALT, ALKPHOS, BILITOT, PROT, ALBUMIN in the last 168 hours. No results for input(s): LIPASE, AMYLASE in the last 168 hours. No results for input(s): AMMONIA in the last 168 hours. CBC: Recent Labs  Lab 09/16/20 2020 09/16/20 2158  WBC 11.2*  --   NEUTROABS 8.0*  --   HGB 15.8* 13.6  HCT 46.2* 40.0  MCV 82.5  --   PLT 323  --    Cardiac Enzymes: No results for input(s): CKTOTAL, CKMB, CKMBINDEX, TROPONINI in the last 168 hours. BNP: Invalid input(s): POCBNP CBG: Recent Labs  Lab 09/17/20 2106 09/18/20 0043 09/18/20 0455 09/18/20 0724 09/18/20 1115  GLUCAP 171* 166* 119* 154* 94   D-Dimer No results for input(s): DDIMER in the last 72 hours. Hgb A1c Recent Labs    09/17/20 0958  HGBA1C 11.0*   Lipid Profile No results for input(s): CHOL, HDL, LDLCALC, TRIG, CHOLHDL, LDLDIRECT in the last 72 hours. Thyroid function studies No results for input(s): TSH, T4TOTAL, T3FREE, THYROIDAB in the last 72 hours.  Invalid input(s): FREET3 Anemia work up No results for input(s): VITAMINB12, FOLATE, FERRITIN, TIBC, IRON, RETICCTPCT in the last 72 hours. Urinalysis    Component Value Date/Time   COLORURINE YELLOW 09/16/2020 2020   APPEARANCEUR HAZY (A) 09/16/2020 2020   LABSPEC >1.030 (H) 09/16/2020 2020   PHURINE 5.5 09/16/2020  2020   GLUCOSEU >=500 (A) 09/16/2020 2020   HGBUR TRACE (A) 09/16/2020 2020   BILIRUBINUR SMALL (A) 09/16/2020 2020   KETONESUR >80 (A) 09/16/2020 2020   PROTEINUR 30 (A) 09/16/2020 2020   UROBILINOGEN 1.0 11/24/2006 1812   NITRITE NEGATIVE 09/16/2020 2020   LEUKOCYTESUR NEGATIVE 09/16/2020 2020   Sepsis Labs Invalid input(s): PROCALCITONIN,  WBC,  LACTICIDVEN Microbiology Recent Results (from the past 240 hour(s))  Resp Panel by RT-PCR (Flu A&B, Covid) Nasopharyngeal Swab     Status: Abnormal   Collection Time: 09/16/20  9:24 PM   Specimen: Nasopharyngeal Swab; Nasopharyngeal(NP) swabs in vial transport medium  Result Value Ref Range Status   SARS Coronavirus 2 by RT PCR POSITIVE (A) NEGATIVE Final    Comment: RESULT CALLED TO, READ BACK BY AND VERIFIED WITH: Jennye Boroughs, RN AT 1213 ON [id number] BY BOWLBY, J (NOTE) SARS-CoV-2 target nucleic acids are DETECTED.  The SARS-CoV-2 RNA is generally detectable in upper respiratory specimens during the acute phase of infection. Positive results are indicative of the presence of the identified virus, but do not rule out bacterial infection or co-infection with other pathogens not detected by the test. Clinical correlation with patient history and other diagnostic information is necessary to determine patient infection status. The expected result is Negative.  Fact Sheet for Patients: EntrepreneurPulse.com.au  Fact Sheet for Healthcare Providers: IncredibleEmployment.be  This test is not yet approved or cleared by the [place] FDA and  has been authorized for detection and/or diagnosis of SARS-CoV-2 by FDA under an Emergency Use Authorization (EUA).  This EUA will remain in effect (meaning thi s test can be used) for the duration of  the COVID-19 declaration under Section 564(b)(1) of the Act, 21 U.S.C. section 360bbb-3(b)(1), unless the authorization is terminated or revoked sooner.      Influenza A by PCR NEGATIVE NEGATIVE Final   Influenza B by PCR NEGATIVE NEGATIVE Final    Comment: (NOTE) The Xpert Xpress SARS-CoV-2/FLU/RSV plus assay is intended as an aid in the diagnosis of influenza from Nasopharyngeal swab specimens and should not be used as a sole basis for treatment. Nasal washings and aspirates are unacceptable for Xpert Xpress SARS-CoV-2/FLU/RSV testing.  Fact Sheet for Patients: EntrepreneurPulse.com.au  Fact Sheet for Healthcare Providers: IncredibleEmployment.be  This test is not yet approved or cleared by the [place] FDA and has been authorized for detection and/or diagnosis of SARS-CoV-2 by FDA under an Emergency Use Authorization (EUA). This EUA will remain in effect (  meaning this test can be used) for the duration of the COVID-19 declaration under Section 564(b)(1) of the Act, 21 U.S.C. section 360bbb-3(b)(1), unless the authorization is terminated or revoked.  Performed at [facility], [address] [place], [place] [address]     Time coordinating discharge: Over 30 minutes  SIGNED:  Lorella Nimrod, MD  [facility] 09/18/2020, 1:13 PM  If 7PM-7AM, please contact night-coverage [email or web address]  This record has been created using Systems analyst. Errors have been sought and corrected,but may not always be located. Such creation errors do not reflect on the standard of care.

## 2020-09-19 ENCOUNTER — Telehealth: Payer: Self-pay | Admitting: *Deleted

## 2020-09-19 LAB — ACETYLCHOLINE RECEPTOR, BINDING: Acety choline binding ab: 0.05 nmol/L (ref 0.00–0.24)

## 2020-09-19 NOTE — Telephone Encounter (Signed)
FMLA paperwork faxed into front office Placed into [place] bin up front  Patient has a NP appt on 7.6.22 @ 1245

## 2020-09-25 ENCOUNTER — Encounter: Payer: Self-pay | Admitting: Family Medicine

## 2020-09-25 ENCOUNTER — Ambulatory Visit (INDEPENDENT_AMBULATORY_CARE_PROVIDER_SITE_OTHER): Payer: BC Managed Care – PPO | Admitting: Family Medicine

## 2020-09-25 ENCOUNTER — Other Ambulatory Visit: Payer: Self-pay

## 2020-09-25 VITALS — BP 118/68 | HR 74 | Temp 98.3°F | Ht 65.0 in | Wt 185.0 lb

## 2020-09-25 DIAGNOSIS — Z23 Encounter for immunization: Secondary | ICD-10-CM

## 2020-09-25 DIAGNOSIS — I7771 Dissection of carotid artery: Secondary | ICD-10-CM

## 2020-09-25 DIAGNOSIS — Z794 Long term (current) use of insulin: Secondary | ICD-10-CM

## 2020-09-25 DIAGNOSIS — E1165 Type 2 diabetes mellitus with hyperglycemia: Secondary | ICD-10-CM

## 2020-09-25 HISTORY — DX: Type 2 diabetes mellitus with hyperglycemia: Z79.4

## 2020-09-25 HISTORY — DX: Type 2 diabetes mellitus with hyperglycemia: E11.65

## 2020-09-25 LAB — MICROALBUMIN / CREATININE URINE RATIO
Creatinine,U: 116.9 mg/dL
Microalb Creat Ratio: 0.6 mg/g (ref 0.0–30.0)
Microalb, Ur: <0.7 mg/dL (ref 0.0–1.9)

## 2020-09-25 MED ORDER — DEXCOM G6 SENSOR MISC: 0 refills | Status: DC

## 2020-09-25 MED ORDER — METFORMIN HCL ER 500 MG PO TB24: 500.0000 mg | ORAL_TABLET | Freq: Every day | ORAL | 2 refills | Status: DC

## 2020-09-25 MED ORDER — DEXCOM G6 TRANSMITTER MISC: 0 refills | Status: DC

## 2020-09-25 MED ORDER — DEXCOM G6 RECEIVER DEVI: 0 refills | Status: DC

## 2020-09-25 NOTE — Patient Instructions (Addendum)
Give [place] 2-3 business days to get the results of your labs back.   Keep the diet clean and stay active.  If you do not hear anything about your referral in the next 1-2 weeks, call our office and ask for an update.  Check your sugars around 2-3 times per week. Alternate checking in the morning before you eat, in the afternoon and before bed. Write them down and bring it to your next appointment.   Please schedule your eye exam.   Let us know if you need anything.

## 2020-09-25 NOTE — Progress Notes (Signed)
Chief Complaint  Patient presents with   New Patient (Initial Visit)       New Patient Visit SUBJECTIVE: HPI: Mackenzie Mcgee is an 40 y.o.female who is being seen for establishing care.  The patient was previously seen near [place] prior to moving.  DM Recently dx'd w diabetes on 09/16/20.  She was admitted to [facility] for day to ketoacidosis.  She was started on Lantus 15 u qhs, metformin. Did not do well on the latter w GI AE's having nausea and vomiting. Sugars have been running mid 100's fasting, some in the 200's. Random sugars running high 100's and 200's.  No low's.  Diet is improving.  She tries to walk.  She does not have any low readings.  She denies any chest pain or shortness of breath.  She has an eye doctor but missed her exam due to testing positive for COVID.  She has never had a pneumonia vaccine.  Patient had a left eyelid droop prompting a stroke work-up.  A left carotid artery dissection was discovered.  She was started on dual antiplatelet therapy with aspirin and Plavix and recommended to follow-up with neurology.  Past Medical History:  Diagnosis Date   COVID-19 virus infection 07/2020   Gestational diabetes    Morbid obesity ([place])    Pregnancy    Past Surgical History:  Procedure Laterality Date   HERNIA REPAIR     WISDOM TOOTH EXTRACTION  2014   Family History  Problem Relation Age of Onset   Alcohol abuse Father    Depression Father    Healthy Mother    No Known Allergies  Current Outpatient Medications:    acetaminophen (TYLENOL) 500 MG tablet, Take 1,000 mg by mouth every 6 (six) hours as needed., Disp: , Rfl:    ascorbic acid (VITAMIN C) 500 MG tablet, Take 1 tablet (500 mg total) by mouth daily., Disp: 90 tablet, Rfl: 0   aspirin EC 81 MG EC tablet, Take 1 tablet (81 mg total) by mouth daily. Swallow whole., Disp: 30 tablet, Rfl: 11   blood glucose meter kit and supplies KIT, Dispense based on patient and insurance preference.  Use up to four times daily as directed., Disp: 1 each, Rfl: 0   clopidogrel (PLAVIX) 75 MG tablet, Take 1 tablet (75 mg total) by mouth daily., Disp: 90 tablet, Rfl: 0   Continuous Blood Gluc Receiver (DEXCOM G6 RECEIVER) DEVI, , Disp: , Rfl:    Continuous Blood Gluc Sensor (DEXCOM G6 SENSOR) MISC, by Does not apply route., Disp: , Rfl:    Continuous Blood Gluc Transmit (DEXCOM G6 TRANSMITTER) MISC, by Does not apply route., Disp: , Rfl:    insulin glargine (LANTUS) 100 UNIT/ML Solostar Pen, Inject 15 Units into the skin daily., Disp: 15 mL, Rfl: 11   Insulin Pen Needle (PEN NEEDLES) 32G X 4 MM MISC, 15 Units by Does not apply route daily., Disp: 100 each, Rfl: 1   metFORMIN (GLUCOPHAGE-XR) 500 MG 24 hr tablet, Take 1 tablet (500 mg total) by mouth daily with breakfast., Disp: 30 tablet, Rfl: 2   zinc sulfate 220 (50 Zn) MG capsule, Take 1 capsule (220 mg total) by mouth daily., Disp: 90 capsule, Rfl: 0  OBJECTIVE: BP 118/68   Pulse 74   Temp 98.3 F (36.8 C) (Oral)   Ht 5' 5"  (1.651 m)   Wt 185 lb (83.9 kg)   SpO2 98%   BMI 30.79 kg/m  General:  well developed, well nourished,  in no apparent distress Skin:  no lesions or sores noted on her feet Lungs:  clear to auscultation, breath sounds equal bilaterally, no respiratory distress Cardio:  regular rate and rhythm, no LE edema Musculoskeletal:  symmetrical muscle groups noted without atrophy or deformity Neuro:  gait normal, sensation intact to pinprick bilaterally on her feet; + left-sided leg droop Psych: well oriented with normal range of affect and appropriate judgment/insight  ASSESSMENT/PLAN: Type 2 diabetes mellitus with hyperglycemia, with long-term current use of insulin ([place]) - Plan: metFORMIN (GLUCOPHAGE-XR) 500 MG 24 hr tablet, Microalbumin / creatinine urine ratio, Continuous Blood Gluc Receiver (DEXCOM G6 RECEIVER) DEVI, Continuous Blood Gluc Sensor (DEXCOM G6 SENSOR) MISC, Continuous Blood Gluc Transmit (DEXCOM G6  TRANSMITTER) MISC  Dissection of left carotid artery ([facility]) - Plan: Ambulatory referral to Neurology  Chronic, uncontrolled.  Okay to stop semaglutide given her fear of thyroid issues.  We will replace with metformin XR 500 mg daily.  Monitor sugars at home.  Counseled on diet and exercise.  Send me readings in a few weeks, I will see her in 6 weeks. New, uncertain prognosis.  Continue Plavix 75 mg daily and aspirin.  Will refer to neurology today. The patient voiced understanding and agreement to the plan.   [place], DO 09/25/20  1:21 PM

## 2020-09-27 ENCOUNTER — Telehealth: Payer: Self-pay | Admitting: *Deleted

## 2020-09-27 NOTE — Telephone Encounter (Signed)
Dexcom was denied by insurance.

## 2020-09-28 NOTE — Telephone Encounter (Signed)
Let's try [employer]. Please send in. Ty.

## 2020-09-30 MED ORDER — BLOOD GLUCOSE MONITOR KIT: PACK | 0 refills | Status: DC

## 2020-09-30 NOTE — Telephone Encounter (Signed)
PCP completed/ Faxed completed for to the [facility]. Sent to scan

## 2020-10-02 ENCOUNTER — Telehealth: Payer: Self-pay | Admitting: Family Medicine

## 2020-10-02 NOTE — Telephone Encounter (Signed)
Called the patient informed of PCP instructions. She did verbalize understanding. 

## 2020-10-02 NOTE — Telephone Encounter (Signed)
Patent states her Sugar has been low since she started on metformin last reading was  68. She wants to know if she should take her insulin at 2pm.

## 2020-10-02 NOTE — Telephone Encounter (Signed)
Stop the insulin, continue the metformin. Cont to monitor sugars. Metformin won't drop her sugars, but insulin will. Ty.

## 2020-10-18 DIAGNOSIS — H18603 Keratoconus, unspecified, bilateral: Secondary | ICD-10-CM | POA: Diagnosis not present

## 2020-10-24 ENCOUNTER — Telehealth: Payer: Self-pay | Admitting: *Deleted

## 2020-10-24 NOTE — Telephone Encounter (Signed)
Prior auth started via cover my meds.  Awaiting on determination.  Key: [id number]

## 2020-10-25 NOTE — Telephone Encounter (Signed)
Insurance has denied this yet again.  It is not covered by her insurance.

## 2020-10-25 NOTE — Telephone Encounter (Signed)
Called informed the patient of denial.  She was aware and is currently using another meter.

## 2020-10-28 DIAGNOSIS — H18621 Keratoconus, unstable, right eye: Secondary | ICD-10-CM | POA: Diagnosis not present

## 2020-11-04 ENCOUNTER — Encounter: Payer: Self-pay | Admitting: Family Medicine

## 2020-11-04 ENCOUNTER — Other Ambulatory Visit: Payer: Self-pay

## 2020-11-04 ENCOUNTER — Ambulatory Visit (INDEPENDENT_AMBULATORY_CARE_PROVIDER_SITE_OTHER): Payer: BC Managed Care – PPO | Admitting: Family Medicine

## 2020-11-04 VITALS — BP 112/70 | HR 69 | Temp 98.1°F | Ht 65.0 in | Wt 176.4 lb

## 2020-11-04 DIAGNOSIS — Z1159 Encounter for screening for other viral diseases: Secondary | ICD-10-CM | POA: Diagnosis not present

## 2020-11-04 DIAGNOSIS — E1165 Type 2 diabetes mellitus with hyperglycemia: Secondary | ICD-10-CM

## 2020-11-04 DIAGNOSIS — L301 Dyshidrosis [pompholyx]: Secondary | ICD-10-CM | POA: Diagnosis not present

## 2020-11-04 DIAGNOSIS — D649 Anemia, unspecified: Secondary | ICD-10-CM

## 2020-11-04 DIAGNOSIS — Z794 Long term (current) use of insulin: Secondary | ICD-10-CM | POA: Diagnosis not present

## 2020-11-04 LAB — BASIC METABOLIC PANEL
BUN: 12 mg/dL (ref 6–23)
CO2: 25 mEq/L (ref 19–32)
Calcium: 9.3 mg/dL (ref 8.4–10.5)
Chloride: 105 mEq/L (ref 96–112)
Creatinine, Ser: 0.64 mg/dL (ref 0.40–1.20)
GFR: 112.09 mL/min (ref >60.00)
Glucose, Bld: 109 mg/dL — ABNORMAL HIGH (ref 70–99)
Potassium: 4 mEq/L (ref 3.5–5.1)
Sodium: 139 mEq/L (ref 135–145)

## 2020-11-04 LAB — IBC + FERRITIN
Ferritin: 96.1 ng/mL (ref 10.0–291.0)
Iron: 147 ug/dL — ABNORMAL HIGH (ref 42–145)
Saturation Ratios: 47.3 % (ref 20.0–50.0)
TIBC: 310.8 ug/dL (ref 250.0–450.0)
Transferrin: 222 mg/dL (ref 212.0–360.0)

## 2020-11-04 LAB — CBC
HCT: 38.9 % (ref 36.0–46.0)
Hemoglobin: 13.1 g/dL (ref 12.0–15.0)
MCHC: 33.7 g/dL (ref 30.0–36.0)
MCV: 85.8 fl (ref 78.0–100.0)
Platelets: 281 10*3/uL (ref 150.0–400.0)
RBC: 4.53 Mil/uL (ref 3.87–5.11)
RDW: 13.7 % (ref 11.5–15.5)
WBC: 5.9 10*3/uL (ref 4.0–10.5)

## 2020-11-04 MED ORDER — TRIAMCINOLONE ACETONIDE 0.1 % EX CREA: 1.0000 "application " | TOPICAL_CREAM | Freq: Two times a day (BID) | CUTANEOUS | 0 refills | Status: DC

## 2020-11-04 MED ORDER — METFORMIN HCL ER 500 MG PO TB24: 500.0000 mg | ORAL_TABLET | Freq: Every day | ORAL | 2 refills | Status: DC

## 2020-11-04 NOTE — Progress Notes (Signed)
Chief Complaint  Patient presents with   Follow-up    Subjective: Patient is a 40 y.o. female here for f/u DM.  Usually sugars running in low 100's fasting. She is walking 30 min/d. Diet has been quite a bit healthier. No low's. She has lost 10 lbs since her last visit intentionally. No Cp or SOB. Compliant w Metformin XR 500 mg/d. No AE's.  Dryness/scaling/itching of hands on sides. No new topicals, fevers, sick contacts, pain, drainage. Has not tried anything thus far. Not spreading.   Past Medical History:  Diagnosis Date   COVID-19 virus infection 07/2020   Gestational diabetes    Morbid obesity (HCC)    Pregnancy     Objective: BP 112/70   Pulse 69   Temp 98.1 F (36.7 C) (Oral)   Ht 5\' 5"  (1.651 m)   Wt 176 lb 6 oz (80 kg)   SpO2 99%   BMI 29.35 kg/m  General: Awake, appears stated age Skin: Scaling/thickening of skin on sides of fingers b/l Lungs: No accessory muscle use Psych: Age appropriate judgment and insight, normal affect and mood  Assessment and Plan: Type 2 diabetes mellitus with hyperglycemia, with long-term current use of insulin (HCC) - Plan: Basic metabolic panel, metFORMIN (GLUCOPHAGE-XR) 500 MG 24 hr tablet  Anemia, unspecified type - Plan: CBC, IBC + Ferritin  Dyshidrotic eczema - Plan: triamcinolone cream (KENALOG) 0.1 %  Encounter for hepatitis C screening test for low risk patient - Plan: Hepatitis C antibody  Chronic, uncontrolled w A1c of 11 at last ck in June. Cont Metformin XR 500 mg/d, monitor sugars, she is doing very well losing wt.  Reported hosp told her to take iron. Will ck labs today. Avoid scented products, emollients, Kenalog prn.  F/u in 2 mo to reck DM, will ck A1c.  The patient voiced understanding and agreement to the plan.  [place], DO 11/04/20  8:01 AM

## 2020-11-04 NOTE — Patient Instructions (Addendum)
Give [place] 2-3 business days to get the results of your labs back.   Keep the diet clean and stay active.  Really strong work losing weight and improving your sugars.  Check your sugars around 2-3 times per week. Alternate checking in the morning before you eat, in the afternoon and before bed. Write them down and bring it to your next appointment.   Try not to scratch as this can make things worse. Avoid scented products while dealing with this. You may resume when the itchiness resolves. Cold/cool compresses can help.   Let us know if you need anything.

## 2020-11-05 LAB — HEPATITIS C ANTIBODY
Hepatitis C Ab: NONREACTIVE
SIGNAL TO CUT-OFF: 0.04 (ref <1.00)

## 2020-11-19 ENCOUNTER — Other Ambulatory Visit: Payer: Self-pay | Admitting: Family Medicine

## 2020-12-02 ENCOUNTER — Telehealth: Payer: Self-pay | Admitting: Family Medicine

## 2020-12-02 DIAGNOSIS — I7771 Dissection of carotid artery: Secondary | ICD-10-CM

## 2020-12-02 NOTE — Telephone Encounter (Signed)
Referral done

## 2020-12-02 NOTE — Telephone Encounter (Signed)
Neuro referred to isn't able to see pt until Nov,she found one that can see her sooner and would need the referral faxed over:  [facility]: Fax [phone]

## 2020-12-02 NOTE — Telephone Encounter (Signed)
OK to place new refer to them. Ty.

## 2020-12-06 DIAGNOSIS — H18621 Keratoconus, unstable, right eye: Secondary | ICD-10-CM | POA: Diagnosis not present

## 2020-12-11 DIAGNOSIS — I7771 Dissection of carotid artery: Secondary | ICD-10-CM | POA: Diagnosis not present

## 2020-12-13 DIAGNOSIS — H18601 Keratoconus, unspecified, right eye: Secondary | ICD-10-CM | POA: Diagnosis not present

## 2020-12-25 ENCOUNTER — Ambulatory Visit: Payer: BC Managed Care – PPO | Admitting: Neurology

## 2021-01-08 ENCOUNTER — Encounter: Payer: Self-pay | Admitting: Family Medicine

## 2021-01-08 ENCOUNTER — Ambulatory Visit (INDEPENDENT_AMBULATORY_CARE_PROVIDER_SITE_OTHER): Payer: Self-pay | Admitting: Family Medicine

## 2021-01-08 ENCOUNTER — Other Ambulatory Visit: Payer: Self-pay

## 2021-01-08 VITALS — BP 108/68 | HR 73 | Temp 98.2°F | Ht 65.0 in | Wt 182.0 lb

## 2021-01-08 DIAGNOSIS — E1165 Type 2 diabetes mellitus with hyperglycemia: Secondary | ICD-10-CM

## 2021-01-08 DIAGNOSIS — Z23 Encounter for immunization: Secondary | ICD-10-CM

## 2021-01-08 DIAGNOSIS — Z794 Long term (current) use of insulin: Secondary | ICD-10-CM

## 2021-01-08 LAB — BASIC METABOLIC PANEL
BUN: 8 mg/dL (ref 6–23)
CO2: 28 mEq/L (ref 19–32)
Calcium: 9.2 mg/dL (ref 8.4–10.5)
Chloride: 106 mEq/L (ref 96–112)
Creatinine, Ser: 0.61 mg/dL (ref 0.40–1.20)
GFR: 113.26 mL/min (ref >60.00)
Glucose, Bld: 115 mg/dL — ABNORMAL HIGH (ref 70–99)
Potassium: 4 mEq/L (ref 3.5–5.1)
Sodium: 139 mEq/L (ref 135–145)

## 2021-01-08 LAB — HEMOGLOBIN A1C: Hgb A1c MFr Bld: 6.5 % (ref 4.6–6.5)

## 2021-01-08 NOTE — Progress Notes (Signed)
Subjective:   Chief Complaint  Patient presents with   Follow-up    Mackenzie Mcgee is a 40 y.o. female here for follow-up of diabetes.   Mackenzie Mcgee's self monitored glucose range is low 100's.  Patient denies hypoglycemic reactions. She checks her glucose levels 2 time(s) per week. Patient does not require insulin.   Medications include: Metformin XR 500 mg/d Diet is fair.  Exercise: walking No CP or SOB.  Last A1c on 09/17/20 was 11.   Past Medical History:  Diagnosis Date   COVID-19 virus infection 07/2020   Gestational diabetes    Morbid obesity (HCC)    Pregnancy      Related testing: Retinal exam: Done Pneumovax: done  Objective:  BP 108/68   Pulse 73   Temp 98.2 F (36.8 C) (Oral)   Ht 5\' 5"  (1.651 m)   Wt 182 lb (82.6 kg)   SpO2 99%   BMI 30.29 kg/m  General:  Well developed, well nourished, in no apparent distress Eyes:  Pupils equal and round, sclera anicteric without injection  Lungs:  CTAB, no access msc use Cardio:  RRR, no bruits, no LE edema Psych: Age appropriate judgment and insight  Assessment:   Type 2 diabetes mellitus with hyperglycemia, with long-term current use of insulin (HCC)   Plan:   Chronic, uncontrolled w last A1c being 11. Will see what A1c is today. Cont Metformin XR 500 mg/d for now. If not controlled, <7, will increase metformin dosage to 1000 mg/d. Counseled on diet and exercise. F/u in 3-6 mo pending results. The patient voiced understanding and agreement to the plan.  [place], DO 01/08/21 8:50 AM

## 2021-01-08 NOTE — Patient Instructions (Addendum)
Give [place] 2-3 business days to get the results of your labs back. Our follow up will be based on your lab results.   Keep the diet clean and stay active.  Let us know if you need anything.

## 2021-01-27 DIAGNOSIS — H18621 Keratoconus, unstable, right eye: Secondary | ICD-10-CM | POA: Diagnosis not present

## 2021-02-06 ENCOUNTER — Ambulatory Visit: Payer: BC Managed Care – PPO | Admitting: Neurology

## 2021-02-24 ENCOUNTER — Encounter: Payer: Self-pay | Admitting: Family Medicine

## 2021-02-28 ENCOUNTER — Other Ambulatory Visit: Payer: Self-pay | Admitting: Family Medicine

## 2021-02-28 ENCOUNTER — Telehealth (INDEPENDENT_AMBULATORY_CARE_PROVIDER_SITE_OTHER): Payer: 59 | Admitting: Family Medicine

## 2021-02-28 ENCOUNTER — Encounter: Payer: Self-pay | Admitting: Family Medicine

## 2021-02-28 ENCOUNTER — Telehealth: Payer: Self-pay | Admitting: Family Medicine

## 2021-02-28 DIAGNOSIS — K219 Gastro-esophageal reflux disease without esophagitis: Secondary | ICD-10-CM

## 2021-02-28 DIAGNOSIS — E1165 Type 2 diabetes mellitus with hyperglycemia: Secondary | ICD-10-CM | POA: Diagnosis not present

## 2021-02-28 DIAGNOSIS — Z794 Long term (current) use of insulin: Secondary | ICD-10-CM

## 2021-02-28 MED ORDER — ONETOUCH DELICA PLUS LANCET33G MISC: 6 refills | Status: DC

## 2021-02-28 MED ORDER — TIRZEPATIDE 5 MG/0.5ML ~~LOC~~ SOAJ: 5.0000 mg | SUBCUTANEOUS | 0 refills | Status: DC

## 2021-02-28 MED ORDER — PANTOPRAZOLE SODIUM 40 MG PO TBEC: 40.0000 mg | DELAYED_RELEASE_TABLET | Freq: Every day | ORAL | 1 refills | Status: DC

## 2021-02-28 MED ORDER — TIRZEPATIDE 2.5 MG/0.5ML ~~LOC~~ SOAJ: 2.5000 mg | SUBCUTANEOUS | 0 refills | Status: AC

## 2021-02-28 NOTE — Progress Notes (Signed)
Chief Complaint  Patient presents with   Weight Loss    Subjective: Patient is a 40 y.o. female here for wt loss. Due to COVID-19 pandemic, we are interacting via web portal for an electronic face-to-face visit. I verified patient's ID using 2 identifiers. Patient agreed to proceed with visit via this method. Patient is at home, I am at office. Patient and I are present for visit.   2 weeks ago, the patient started having hoarseness in her voice in addition to bulging.  Her mother has a history of reflux and thinks it is that.  She has not tried anything so far.  No waterbrash, nausea/vomiting, diarrhea, abdominal pain, burning in the chest, or sour/metallic taste in her mouth.  She denies any runny/stuffy nose or recent illness.  Patient has a history of diabetes on Metformin. A1c came down quite a bit from 11 to 6.5. Currently on Metformin XR 500 mg/d. Having some lows. Diet is better, staying active. Interested in something that would help her lose weight as well.   Past Medical History:  Diagnosis Date   COVID-19 virus infection 07/2020   Gestational diabetes    Morbid obesity (HCC)    Pregnancy     Objective: No conversational dyspnea Age appropriate judgment and insight Nml affect and mood  Assessment and Plan: Gastroesophageal reflux disease, unspecified whether esophagitis present - Plan: pantoprazole (PROTONIX) 40 MG tablet  Type 2 diabetes mellitus with hyperglycemia, with long-term current use of insulin (HCC) - Plan: tirzepatide (MOUNJARO) 2.5 MG/0.5ML Pen, tirzepatide (MOUNJARO) 5 MG/0.5ML Pen  New, probably self resolving. Pepcid 20 mg bid for a few weeks. OK to add Protonix. Reck if no improvement.  Chronic, AE of med. Stop Metformin, change to [facility]. Instructed her to go to [employer] site for payment card.  F/u in 2 mo.  The patient voiced understanding and agreement to the plan.  Jilda Roche [place], DO 02/28/21  12:23 PM

## 2021-02-28 NOTE — Telephone Encounter (Signed)
Pt needs prior authorization for tirzepatide [facility]) 2.5 MG/0.5ML Pen

## 2021-02-28 NOTE — Telephone Encounter (Signed)
Will do but have not received anything yet from pharmacy

## 2021-02-28 NOTE — Telephone Encounter (Signed)
PA started today   Key :  [id number]

## 2021-03-03 NOTE — Telephone Encounter (Signed)
PA approval received.

## 2021-03-13 NOTE — Telephone Encounter (Signed)
Approvedon December 9 Request Reference Number: [id number]. MOUNJARO INJ 5MG /0.5 is approved through 02/28/2022. Your patient may now fill this prescription and it will be covered.  Key: 14/11/2021

## 2021-05-19 ENCOUNTER — Encounter: Payer: Self-pay | Admitting: Family Medicine

## 2021-05-19 ENCOUNTER — Telehealth (INDEPENDENT_AMBULATORY_CARE_PROVIDER_SITE_OTHER): Payer: 59 | Admitting: Family Medicine

## 2021-05-19 DIAGNOSIS — F411 Generalized anxiety disorder: Secondary | ICD-10-CM

## 2021-05-19 DIAGNOSIS — F339 Major depressive disorder, recurrent, unspecified: Secondary | ICD-10-CM | POA: Diagnosis not present

## 2021-05-19 HISTORY — DX: Major depressive disorder, recurrent, unspecified: F33.9

## 2021-05-19 HISTORY — DX: Generalized anxiety disorder: F41.1

## 2021-05-19 MED ORDER — CITALOPRAM HYDROBROMIDE 20 MG PO TABS: 20.0000 mg | ORAL_TABLET | Freq: Every day | ORAL | 3 refills | Status: DC

## 2021-05-19 MED ORDER — HYDROXYZINE HCL 25 MG PO TABS: 25.0000 mg | ORAL_TABLET | Freq: Three times a day (TID) | ORAL | 1 refills | Status: DC | PRN

## 2021-05-19 NOTE — Progress Notes (Signed)
Chief Complaint  Patient presents with   Stress    anxiety    Subjective Mackenzie Mcgee is an 41 y.o. female who presents with anxiety. Due to COVID-19 pandemic, we are interacting via web portal for an electronic face-to-face visit. I verified patient's ID using 2 identifiers. Patient agreed to proceed with visit via this method. Patient is at home, I am at office. Patient and I are present for visit.   Symptoms began 3 weeks ago. Anxiety symptoms: fatigue, insomnia, irritable, palpitations, racing thoughts, panic attacks . Depressive symptoms depressed mood, anhedonia, fatigue, feelings of worthlessness/guilt, difficulty concentrating, hopelessness,. Family history significant for anxiety. Possible organic causes contributing are: none Social stressors include work, father recently dx'd w cancer, single mom to 68 yo She is not currently on any medication. She was on Zoloft around 2003.  She is not following with a psychologist.  Past Medical History:  Diagnosis Date   COVID-19 virus infection 07/2020   Gestational diabetes    Morbid obesity (HCC)    Pregnancy      Family History Family History  Problem Relation Age of Onset   Alcohol abuse Father    Depression Father    Healthy Mother     Exam No conversational dyspnea Age appropriate judgment and insight Tearful during exam  Assessment and Plan  GAD (generalized anxiety disorder) - Plan: citalopram (CELEXA) 20 MG tablet, hydrOXYzine (ATARAX) 25 MG tablet, Ambulatory referral to Psychiatry  Depression, recurrent (HCC) - Plan: citalopram (CELEXA) 20 MG tablet, Ambulatory referral to Psychiatry  Chronic, not controlled. Start Celexa 10 mg/d for 2 weeks and then increase to 20 mg/d. Will refer to psych. Counseled on adjunctive treatment with exercise/physical activity. Counseling info provided along w psych self-referral. Discussed FMLA, would allow for 1 month to start with and go from there.  F/u in 1  mo. Patient voiced understanding and agreement to the plan.  Jilda Roche [place], DO 05/19/21 9:06 AM

## 2021-05-20 ENCOUNTER — Other Ambulatory Visit: Payer: Self-pay | Admitting: Family Medicine

## 2021-05-20 MED ORDER — AMOXICILLIN 500 MG PO CAPS: 1000.0000 mg | ORAL_CAPSULE | Freq: Every day | ORAL | 0 refills | Status: AC

## 2021-06-01 ENCOUNTER — Other Ambulatory Visit: Payer: Self-pay | Admitting: Family Medicine

## 2021-06-01 DIAGNOSIS — E1165 Type 2 diabetes mellitus with hyperglycemia: Secondary | ICD-10-CM

## 2021-06-23 ENCOUNTER — Encounter: Payer: Self-pay | Admitting: Family Medicine

## 2021-06-25 ENCOUNTER — Encounter: Payer: Self-pay | Admitting: Family Medicine

## 2021-06-25 ENCOUNTER — Other Ambulatory Visit: Payer: Self-pay | Admitting: Family Medicine

## 2021-06-25 ENCOUNTER — Ambulatory Visit: Payer: 59 | Admitting: Family Medicine

## 2021-06-25 VITALS — BP 106/68 | HR 64 | Temp 98.7°F | Ht 65.0 in | Wt 173.0 lb

## 2021-06-25 DIAGNOSIS — Z794 Long term (current) use of insulin: Secondary | ICD-10-CM | POA: Diagnosis not present

## 2021-06-25 DIAGNOSIS — E1165 Type 2 diabetes mellitus with hyperglycemia: Secondary | ICD-10-CM

## 2021-06-25 DIAGNOSIS — F411 Generalized anxiety disorder: Secondary | ICD-10-CM | POA: Diagnosis not present

## 2021-06-25 LAB — COMPREHENSIVE METABOLIC PANEL
ALT: 7 U/L (ref 0–35)
AST: 11 U/L (ref 0–37)
Albumin: 4.3 g/dL (ref 3.5–5.2)
Alkaline Phosphatase: 29 U/L — ABNORMAL LOW (ref 39–117)
BUN: 11 mg/dL (ref 6–23)
CO2: 24 mEq/L (ref 19–32)
Calcium: 9.5 mg/dL (ref 8.4–10.5)
Chloride: 106 mEq/L (ref 96–112)
Creatinine, Ser: 0.62 mg/dL (ref 0.40–1.20)
GFR: 112.45 mL/min (ref >60.00)
Glucose, Bld: 95 mg/dL (ref 70–99)
Potassium: 4.1 mEq/L (ref 3.5–5.1)
Sodium: 138 mEq/L (ref 135–145)
Total Bilirubin: 0.5 mg/dL (ref 0.2–1.2)
Total Protein: 7 g/dL (ref 6.0–8.3)

## 2021-06-25 LAB — LIPID PANEL
Cholesterol: 163 mg/dL (ref 0–200)
HDL: 56.3 mg/dL (ref >39.00)
LDL Cholesterol: 95 mg/dL (ref 0–99)
NonHDL: 107.15
Total CHOL/HDL Ratio: 3
Triglycerides: 60 mg/dL (ref 0.0–149.0)
VLDL: 12 mg/dL (ref 0.0–40.0)

## 2021-06-25 LAB — HEMOGLOBIN A1C: Hgb A1c MFr Bld: 5.8 % (ref 4.6–6.5)

## 2021-06-25 MED ORDER — BUSPIRONE HCL 7.5 MG PO TABS: 7.5000 mg | ORAL_TABLET | Freq: Two times a day (BID) | ORAL | 2 refills | Status: DC

## 2021-06-25 MED ORDER — MOUNJARO 5 MG/0.5ML ~~LOC~~ SOAJ: SUBCUTANEOUS | 2 refills | Status: DC

## 2021-06-25 NOTE — Progress Notes (Signed)
Chief Complaint  ?Patient presents with  ? Weight Loss  ?  Increase dose of mounjaro ?Note for Short Term disability ?  ? Stress  ? ? ?Subjective ?Mackenzie Mcgee presents for f/u anxiety/depression. ? ?Pt is currently being treated with Celexa 20 mg/d.  ?Reports some improvement with depressive symptoms since treatment. ?She is still having quite a bit of anxiety. ?Work has been quite stressful and she is looking for other positions.  ?No thoughts of harming self or others. ?No self-medication with alcohol, prescription drugs or illicit drugs. ?Pt is not following with a counselor/psychologist. ?Has appt with psychiatry on 4/26.  ? ?Diabetes-check sugars routinely, usually in the low 100s, sometimes in the 80s or 90s.  She is not having symptoms of hypoglycemia.  Diet has been fair, getting some walking in.  She is compliant with Mounjaro 5 mg weekly.  No adverse effects.  No chest pain or shortness of breath. ? ?Past Medical History:  ?Diagnosis Date  ? COVID-19 virus infection 07/2020  ? Gestational diabetes   ? Morbid obesity ([place])   ? Pregnancy   ? ?Allergies as of 06/25/2021   ?No Known Allergies ?  ? ?  ?Medication List  ?  ? ?  ? Accurate as of June 25, 2021  9:10 AM. If you have any questions, ask your nurse or doctor.  ?  ?  ? ?  ? ?aspirin 81 MG EC tablet ?Take 1 tablet (81 mg total) by mouth daily. Swallow whole. ?  ?blood glucose meter kit and supplies Kit ?Dispense based on patient and insurance preference. Use up to four times daily as directed. ?  ?citalopram 20 MG tablet ?Commonly known as: CELEXA ?Take 1 tablet (20 mg total) by mouth daily. ?  ?hydrOXYzine 25 MG tablet ?Commonly known as: ATARAX ?Take 1-3 tablets (25-75 mg total) by mouth every 8 (eight) hours as needed for anxiety. ?  ?Mounjaro 5 MG/0.5ML Pen ?Generic drug: tirzepatide ?INJECT 1 PEN SUBCUTANEOUSLY ONCE A WEEK ?  ?OneTouch Delica Plus [id number] Misc ?USE AS DIRECTED UP TO FOUR  TIMES  DAILY ?  ?OneTouch Verio test  strip ?Generic drug: glucose blood ?USE AS DIRECTED UP TO FOUR  TIMES  DAILY ?  ?pantoprazole 40 MG tablet ?Commonly known as: PROTONIX ?Take 1 tablet (40 mg total) by mouth daily. ?  ?triamcinolone cream 0.1 % ?Commonly known as: KENALOG ?Apply 1 application topically 2 (two) times daily. ?  ? ?  ? ? ?Exam ?BP 106/68   Pulse 64   Temp 98.7 ?F (37.1 ?C) (Oral)   Ht 5' 5"  (1.651 m)   Wt 173 lb (78.5 kg)   SpO2 99%   BMI 28.79 kg/m?  ?General:  well developed, well nourished, in no apparent distress ?Cardiac: RRR ?Lungs:  CTAB. No respiratory distress ?Psych: well oriented with normal range of affect and age-appropriate judgement/insight, alert and oriented x4. ? ?Assessment and Plan ? ?GAD (generalized anxiety disorder) ? ?Type 2 diabetes mellitus with hyperglycemia, with long-term current use of insulin ([place]) ? ?Chronic, not controlled.  She requested to be written out for the rest of the month for work as she waits on the appointment for psychiatry on 4/26.  She is also looking for a new job with less stress associated with it.  Counseled on exercise.  Continue Celexa 20 mg daily.  Add BuSpar 7.5 mg twice daily.  We will not need to follow-up on this as her appointment with psychiatry is on 4/26. ?Chronic, controlled.  Check labs, continue Mounjaro 5 mg weekly. ?F/u in 6 months for physical or as needed. ?The patient voiced understanding and agreement to the plan. ? ?Shelda Pal, DO ?06/25/21 ?9:10 AM ? ? ?

## 2021-06-25 NOTE — Patient Instructions (Addendum)
Please do not miss your appointment with [name].  ? ?Aim to do some physical exertion for 150 minutes per week. This is typically divided into 5 days per week, 30 minutes per day. The activity should be enough to get your heart rate up. Anything is better than nothing if you have time constraints. ? ?Give [place] 2-3 business days to get the results of your labs back.  ? ?Let us know if you need anything. ? ?

## 2021-06-30 ENCOUNTER — Ambulatory Visit: Payer: 59 | Admitting: Family Medicine

## 2021-07-16 ENCOUNTER — Telehealth (HOSPITAL_COMMUNITY): Payer: Self-pay | Admitting: Psychiatry

## 2021-07-16 ENCOUNTER — Encounter (HOSPITAL_COMMUNITY): Payer: Self-pay | Admitting: Psychiatry

## 2021-07-16 ENCOUNTER — Telehealth (HOSPITAL_BASED_OUTPATIENT_CLINIC_OR_DEPARTMENT_OTHER): Payer: 59 | Admitting: Psychiatry

## 2021-07-16 VITALS — Wt 170.0 lb

## 2021-07-16 DIAGNOSIS — F331 Major depressive disorder, recurrent, moderate: Secondary | ICD-10-CM | POA: Diagnosis not present

## 2021-07-16 DIAGNOSIS — F411 Generalized anxiety disorder: Secondary | ICD-10-CM | POA: Diagnosis not present

## 2021-07-16 MED ORDER — BUPROPION HCL ER (XL) 150 MG PO TB24: 150.0000 mg | ORAL_TABLET | Freq: Every day | ORAL | 0 refills | Status: DC

## 2021-07-16 NOTE — Telephone Encounter (Signed)
D: [name] referred pt to MH-IOP.  A:  Oriented pt.  Encouraged pt to verify her benefits.  Pt is scheduled for a CCA tomorrow at 9:30 a.m; start MH-IOP on 07-21-21.  Inform [name].  R:  Pt receptive. ?
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
## 2021-07-17 ENCOUNTER — Other Ambulatory Visit (HOSPITAL_COMMUNITY): Payer: 59 | Attending: Psychiatry | Admitting: Psychiatry

## 2021-07-17 NOTE — Progress Notes (Addendum)
Virtual Visit via Video Note ? ?I connected with Mackenzie MallowStephanie M Robar on @TODAY @ at  9:30 AM EDT by a video enabled telemedicine application and verified that I am speaking with the correct person using two identifiers. ? ?Location: ?Patient: at home ?Provider: at office ?  ?I discussed the limitations of evaluation and management by telemedicine and the availability of in person appointments. The patient expressed understanding and agreed to proceed. ? ?I discussed the assessment and treatment plan with the patient. The patient was provided an opportunity to ask questions and all were answered. The patient agreed with the plan and demonstrated an understanding of the instructions. ?  ?The patient was advised to call back or seek an in-person evaluation if the symptoms worsen or if the condition fails to improve as anticipated. ? ?I provided 90 minutes of non-face-to-face time during this encounter. ? ? ?Jaylenn Altier, RITA, M.Ed,CNA ? ? ?Comprehensive Clinical Assessment (CCA) Note ? ?07/17/2021 ?Mackenzie MallowStephanie M Mcgee ?[id number] ? ?Chief Complaint:  ?Chief Complaint  ?Patient presents with  ? Depression  ? Anxiety  ? ?Visit Diagnosis: F33.1 & F41.1, F.900 ? ? ?CCA Screening, Triage and Referral (STR) ? ?Patient Reported Information ?How did you hear about us? Other (Comment) ? ?Referral name: Dr. Kathryne SharperSyed Arfeen ? ?Referral phone number: No data recorded ? ?Whom do you see for routine medical problems? Primary Care ? ?Practice[facility]Facility Name[facility] [place] [place] ? ?Practice/Facility Phone Number: No data recorded ?Name of Contact: No data recorded ?Contact Number: No data recorded ?Contact Fax Number: No data recorded ?Prescriber Name: Dr. Carmelia RollerWendling ? ?Prescriber Address (if known): No data recorded ? ?What Is the Reason for Your Visit/Call Today? Worsening depressivea and anxiety ? ?How Long Has This Been Causing You Problems? > than 6 months ? ?What Do You Feel Would Help You the Most Today? Treatment for  Depression or other mood problem; Stress Management ? ? ?Have You Recently Been in Any Inpatient Treatment (Hospital/Detox/Crisis Center/28-Day Program)? No ? ?Name/Location of Program/Hospital:No data recorded ?How Long Were You There? No data recorded ?When Were You Discharged? No data recorded ? ?Have You [facility]? No ? ?[facility]? No data recorded ? ?Have You Recently Had Any Thoughts About Hurting Yourself? No ? ?Are You Planning to Commit Suicide/Harm Yourself At This time? No ? ? ?Have you Recently Had Thoughts About Hurting Someone Karolee Ohslse? No ? ?Explanation: No data recorded ? ?Have You Used Any Alcohol or Drugs in the Past 24 Hours? No ? ?How Long Ago Did You Use Drugs or Alcohol? No data recorded ?What Did You Use and How Much? No data recorded ? ?Do You Currently Have a Therapist/Psychiatrist? Yes ? ?Name of Therapist/Psychiatrist: Dr. Kathryne SharperSyed Arfeen ? ? ?Have You Been Recently Discharged From Any Office Practice or Programs? No ? ?Explanation of Discharge From Practice/Program: No data recorded ? ?  ?CCA Screening Triage Referral Assessment ?Type of Contact: No data recorded ?Is this Initial or Reassessment? No data recorded ?Date Telepsych consult ordered in [facility]:  No data recorded ?Time Telepsych consult ordered in [facility]:  No data recorded ? ?Patient Reported Information Reviewed? No data recorded ?Patient Left Without Being Seen? No data recorded ?Reason for Not Completing Assessment: No data recorded ? ?Collateral Involvement: No data recorded ? ?Does Patient Have a Automotive engineerCourt Appointed Legal Guardian? No data recorded ?Name and Contact of Legal Guardian: No data recorded ?If Minor and Not Living with Parent(s), Who has  Custody? No data recorded ?Is CPS involved or ever been involved? Never ? ?Is APS involved or ever been involved? Never ? ? ?Patient Determined To Be At Risk for Harm To Self or Others Based on Review of Patient Reported Information or  Presenting Complaint? No ? ?Method: No data recorded ?Availability of Means: No data recorded ?Intent: No data recorded ?Notification Required: No data recorded ?Additional Information for Danger to Others Potential: No data recorded ?Additional Comments for Danger to Others Potential: No data recorded ?Are There Guns or Other Weapons in Your Home? No data recorded ?Types of Guns/Weapons: No data recorded ?Are These Weapons Safely Secured?                            No data recorded ?Who Could Verify You Are Able To Have These Secured: No data recorded ?Do You Have any Outstanding Charges, Pending Court Dates, Parole/Probation? No data recorded ?Contacted To Inform of Risk of Harm To Self or Others: No data recorded ? ?Location of Assessment: Other (comment) ? ? ?Does Patient Present under Involuntary Commitment? No ? ?IVC Papers Initial File Date: No data recorded ? ?[place] of Residence: [place] ? ? ?Patient Currently Receiving the Following Services: Medication Management ? ? ?Determination of Need: Routine (7 days) ? ? ?Options For Referral: Intensive Outpatient Therapy ? ? ? ? ?CCA Biopsychosocial ?Intake/Chief Complaint:  This is a 41 yr old, single, employed, [place] American female who was referred per Dr. Kathryne Sharper; treatment for worsening depressive and anxiety symptoms.  According to pt, hr sx's started worsening Oct. 2022.  Triggers/Stressors:  1) Oct. 2022 she started a new job (switched to a new company ([employer]).  States she switched in order to make more money, but it's very stressful.  "I have a very high caseload, two of my coworkers recently quit, my supervisor isn't supportive.  2) 05-07-21 father was diagnosed with prostate cancer.  3) Medical:  June 2022 went in for her regular physical and was dx'd with Diabetes.  4) Chid:  Pt has a 77 yr old daughter whom she is coparenting with 4 yr old's father.  In 2021 he threatened to take the baby back to [place].  He is no longer threatening to take the  toddler, and visits every Sunday.  Pt denies any prior psychiatric hospitalizations or any suicide attempts.  PCP referred pt to see [name].  Has seen him one time.  Family hx:  Father (Bipolar, hx of drugs/ETOH). ? ?Current Symptoms/Problems: Sadness, anxiety, decreased appetite (lost 25lbs within four months), poor sleep, decreased concentration, poor self-esteem, tearful, ruminating thoughts, no energy, anhedonia, irritable ? ? ?Patient Reported Schizophrenia/Schizoaffective Diagnosis in Past: No ? ? ?Strengths: "I am very dependable.  I am a great listener." ? ?Preferences: "I need to work on being overly sensitive." ? ?Abilities: No data recorded ? ?Type of Services Patient Feels are Needed: MH-IOP ? ? ?Initial Clinical Notes/Concerns: Requires redirection at times; but is easy to redirect ? ? ?Mental Health Symptoms ?Depression:   ?Change in energy/activity; Difficulty Concentrating; Fatigue; Increase/decrease in appetite; Irritability; Sleep (too much or little); Tearfulness ?  ?Duration of Depressive symptoms:  ?Greater than two weeks ?  ?Mania:   ?N/A ?  ?Anxiety:    ?Worrying ?  ?Psychosis:   ?None ?  ?Duration of Psychotic symptoms: No data recorded  ?Trauma:   ?None ?  ?Obsessions:   ?N/A ?  ?Compulsions:   ?N/A ?  ?  Inattention:   ?None ?  ?Hyperactivity/Impulsivity:   ?Feeling of restlessness; Blurts out answers ?  ?Oppositional/Defiant Behaviors:   ?N/A ?  ?Emotional Irregularity:   ?N/A ?  ?Other Mood/Personality Symptoms:  No data recorded  ? ?Mental Status Exam ?Appearance and self-care  ?Stature:   ?Average ?  ?Weight:   ?Average weight ?  ?Clothing:   ?Casual ?  ?Grooming:   ?Normal ?  ?Cosmetic use:   ?Age appropriate ?  ?Posture/gait:   ?Normal ?  ?Motor activity:   ?Not Remarkable ?  ?Sensorium  ?Attention:   ?Distractible ?  ?Concentration:   ?Scattered; Variable ?  ?Orientation:   ?X5 ?  ?Recall/memory:   ?Normal ?  ?Affect and Mood  ?Affect:   ?Anxious ?  ?Mood:   ?Depressed ?  ?Relating   ?Eye contact:   ?Normal ?  ?Facial expression:   ?Responsive ?  ?Attitude toward examiner:   ?Cooperative ?  ?Thought and Language  ?Speech flow:  ?Normal ?  ?Thought content:   ?Appropriate to Mood and Circu

## 2021-07-21 ENCOUNTER — Other Ambulatory Visit (HOSPITAL_COMMUNITY): Payer: 59 | Attending: Family Medicine | Admitting: Psychiatry

## 2021-07-21 ENCOUNTER — Encounter (HOSPITAL_COMMUNITY): Payer: Self-pay | Admitting: Psychiatry

## 2021-07-21 DIAGNOSIS — F411 Generalized anxiety disorder: Secondary | ICD-10-CM | POA: Diagnosis present

## 2021-07-21 DIAGNOSIS — F331 Major depressive disorder, recurrent, moderate: Secondary | ICD-10-CM | POA: Diagnosis not present

## 2021-07-21 NOTE — Plan of Care (Signed)
Pt is an active participant in her treatment plan. ?

## 2021-07-21 NOTE — Progress Notes (Signed)
Virtual Visit via Video Note ?  ?I connected with Elenore Paddy. Fate on 07/21/21 at  9:00 AM EDT by a video enabled telemedicine application and verified that I am speaking with the correct person using two identifiers. ?  ?At orientation to the IOP program, Case Manager discussed the limitations of evaluation and management by telemedicine and the availability of in person appointments. The patient expressed understanding and agreed to proceed with virtual visits throughout the duration of the program. ?  ?Location:  ?Patient: Patient Home ?Provider: Counselor Home Office ?  ?History of Present Illness: ?MDD and GAD ?  ?Observations/Objective: ?Check In: Case Manager checked in with all participants to review discharge dates, insurance authorizations, work-related documents and needs from the treatment team regarding medications. Ashle stated needs and engaged in discussion.  ?  ?Initial Therapeutic Activity: Counselor facilitated a check-in with Aileana to assess for safety, sobriety and medication compliance.  Counselor also inquired about Vela's current emotional ratings, as well as any significant changes in thoughts, feelings or behavior since previous check in.  Daila presented for session on time and was alert, oriented x5, with no evidence or self-report of active SI/HI or A/V H.  Akaila reported compliance with medication and denied use of alcohol or illicit substances.  Zanyah reported scores of 6/10 for depression, 7/10 for anxiety, and 0/10 for anger/irritability.  Atlee denied any recent panic attacks.  Shivani reported that a recent success was spending time with family in [place] over the weekend, as well as visiting the zoo.  Loana reported that a recent struggle was experiencing an outburst over the weekend towards her mother, stating ?She tried to force me to do something I didn't want to do?Marland Kitchen  Fatou reported that her goal today is to attend a birthday  party with her daughter.    ? ?Second Therapeutic Activity: Counselor introduced topic of anger management today.  Counselor virtually shared a handout with members on this subject featuring a variety of coping skills, and facilitated discussion on these approaches.  Examples included raising awareness of anger triggers, practicing deep breathing, keeping an anger log to better understand episodes, using diversion activities to distract oneself for 30 minutes, taking a time out when necessary, and being mindful of warning signs tied to thoughts or behavior.  Counselor inquired about which techniques group members have used before, what has proved to be helpful, what their unique warning signs might be, as well as what they will try out in the future to assist with de-escalation.  Intervention was effective, as evidenced by [name] participating in discussion on activity, and reporting that she has dealt with anger recently due to problems with work, family, and finances.  Gyanna reported that she dislikes confrontation and attempts to conceal her anger when possible, especially with her mother, who is a significant trigger.  Sharvi reported that additional triggers include family arguments, making errors and mistakes, feeling like time is wasted, people being late, feeling ignored, being embarrassed, treated unfairly, feeling helpless or out of control, experiencing anxiety, financial problems, work conflicts, being insulted, criticized or lied to, or hearing about child abuse.  Cathryne reported that warning signs include shutting down and becoming very quiet, or feeling increase in body temperature, having sleep issues, nausea, or grinding her teeth.  Janey reported that she will work to manage anger more effectively by using coping skills such as becoming more assertive in order to set boundaries with triggering family members like her mother, meditating more consistently,  and practicing deep  breathing.   ? ?Assessment and Plan: ?Patrick has completed MHIOP today and agreed to follow up with her therapist, and psychiatrist.  Counselor recommends adherence to crisis/safety plan, taking medications as prescribed, and following up with medical professionals if any issues arise. ?  ?Follow Up Instructions: ?Hailee was advised to call back or seek an in-person evaluation if the symptoms worsen or if the condition fails to improve as anticipated. ?  ?Collaboration of Care:   Medication Management AEB [name], NP  ?                                         Case Manager AEB [name], CNA  ? ? ?Patient/Guardian was advised Release of Information must be obtained prior to any record release in order to collaborate their care with an outside provider. Patient/Guardian was advised if they have not already done so to contact the registration department to sign all necessary forms in order for [place] to release information regarding their care.  ? ?Consent: Patient/Guardian gives verbal consent for treatment and assignment of benefits for services provided during this visit. Patient/Guardian expressed understanding and agreed to proceed. ? ?I provided 180 minutes of non-face-to-face time during this encounter. ?  ?[name], LCSW, LCAS ?07/21/21  ?

## 2021-07-22 ENCOUNTER — Other Ambulatory Visit (HOSPITAL_COMMUNITY): Payer: 59

## 2021-07-23 ENCOUNTER — Other Ambulatory Visit (HOSPITAL_COMMUNITY): Payer: 59 | Admitting: Family

## 2021-07-23 DIAGNOSIS — F331 Major depressive disorder, recurrent, moderate: Secondary | ICD-10-CM

## 2021-07-23 DIAGNOSIS — F411 Generalized anxiety disorder: Secondary | ICD-10-CM | POA: Diagnosis not present

## 2021-07-23 NOTE — Progress Notes (Signed)
Virtual Visit via Video Note ?  ?I connected with Mackenzie Mcgee. [place] on 07/23/21 at  9:00 AM EDT by a video enabled telemedicine application and verified that I am speaking with the correct person using two identifiers. ?  ?At orientation to the IOP program, Case Manager discussed the limitations of evaluation and management by telemedicine and the availability of in person appointments. The patient expressed understanding and agreed to proceed with virtual visits throughout the duration of the program. ?  ?Location:  ?Patient: Patient Home ?Provider: Counselor Home Office ?  ?History of Present Illness: ?MDD and GAD ?  ?Observations/Objective: ?Check In: Case Manager checked in with all participants to review discharge dates, insurance authorizations, work-related documents and needs from the treatment team regarding medications. Renata stated needs and engaged in discussion.  ?  ?Initial Therapeutic Activity: Counselor facilitated a check-in with Rhionna to assess for safety, sobriety and medication compliance.  Counselor also inquired about Kihanna's current emotional ratings, as well as any significant changes in thoughts, feelings or behavior since previous check in.  Sophina presented for session on time and was alert, oriented x5, with no evidence or self-report of active SI/HI or A/V H.  Khrystyne reported compliance with medication and denied use of alcohol or illicit substances.  Prentiss reported scores of 3/10 for depression, 2/10 for anxiety, and 0/10 for anger/irritability.  Dehlia denied any recent outbursts or panic attacks.  Macayla reported that a recent success was spending time with her daughter yesterday at school and then shopping with her afterward.  Alencia reported that a recent struggle was having to talk with HR at her job, stating ?We talked about short term disability, but I need to talk to my doctor first?.  Aasia reported that her goal today is to get out of the  house and do some landscaping by laying mulch down.   ? ?Second Therapeutic Activity: Counselor introduced Peggye Fothergill, [employer] Pharmacist, to provide psychoeducation on topic of medication compliance with members today.  Michelle Nasuti provided psychoeducation on classes of medications such as antidepressants, antipsychotics, what symptoms they are intended to treat, and any side effects one might encounter while on a particular prescription.  Time was allowed for clients to ask any questions they might have of [facility] regarding this specialty.  Intervention was effective, as evidenced by [name] participating in discussion with speaker on the subject, inquiring about her current medication regimen and whether it is typical to take Hydroxyzine and Wellbutrin concurrently, or if unwanted side effects could result.  Aryann was receptive to feedback from pharmacist on how these medications are intended to treat symptoms, potential side effects, and encouraged her to keep doctor updated on any undesirable changes she observes.   ? ?Third Therapeutic Activity: Counselor introduced topic of 'urge surfing' today.  Counselor explained how this technique can be used to avoid acting upon a behavior that needs to be reduced or stopped completely.  Counselor provided common examples of maladaptive behaviors, such as smoking, overeating, substance use, excessive spending, lashing out emotionally, and more.  Counselor explained how urges rarely last more than 30 minutes if they are not ruminated upon, and attempts to fight or suppress the urge can ultimately cause the problem to grow.  Counselor guided members through practice of combination mindfulness, relaxation, and visualization exercises in order to ?surf? an urge of their concern until it faded.  Intervention was effective, as evidenced by [name] participating in urge surfing activity and reporting that she believes this could aid in  improving her refusal skills, since she  typically has the urge to give in and say ?Yes? to requests from other people, which leads to guilt and regret afterward.  Yeslie reported that she felt more relaxed and self-assured after practice today.    ? ?Assessment and Plan: ?Alvaretta has completed MHIOP today and agreed to follow up with her therapist, and psychiatrist.  Counselor recommends adherence to crisis/safety plan, taking medications as prescribed, and following up with medical professionals if any issues arise. ?  ?Follow Up Instructions: ?Raquelle was advised to call back or seek an in-person evaluation if the symptoms worsen or if the condition fails to improve as anticipated. ?  ?Collaboration of Care:   Medication Management AEB [name], NP  ?                                         Case Manager AEB Dellia Nims, CNA  ? ? ?Patient/Guardian was advised Release of Information must be obtained prior to any record release in order to collaborate their care with an outside provider. Patient/Guardian was advised if they have not already done so to contact the registration department to sign all necessary forms in order for [place] to release information regarding their care.  ? ?Consent: Patient/Guardian gives verbal consent for treatment and assignment of benefits for services provided during this visit. Patient/Guardian expressed understanding and agreed to proceed. ? ?I provided 180 minutes of non-face-to-face time during this encounter. ?  ?Shade Flood, LCSW, LCAS ?07/23/21  ?

## 2021-07-24 ENCOUNTER — Other Ambulatory Visit (HOSPITAL_COMMUNITY): Payer: 59 | Admitting: Licensed Clinical Social Worker

## 2021-07-24 DIAGNOSIS — F411 Generalized anxiety disorder: Secondary | ICD-10-CM | POA: Diagnosis not present

## 2021-07-24 DIAGNOSIS — F331 Major depressive disorder, recurrent, moderate: Secondary | ICD-10-CM

## 2021-07-24 NOTE — Progress Notes (Signed)
Virtual Visit via Video Note ?  ?I connected with Mackenzie Mcgee. Klingensmith on 07/24/21 at  9:00 AM EDT by a video enabled telemedicine application and verified that I am speaking with the correct person using two identifiers. ?  ?At orientation to the IOP program, Case Manager discussed the limitations of evaluation and management by telemedicine and the availability of in person appointments. The patient expressed understanding and agreed to proceed with virtual visits throughout the duration of the program. ?  ?Location:  ?Patient: Patient Home ?Provider: [facility] ?  ?History of Present Illness: ?MDD and GAD ?  ?Observations/Objective: ?Check In: Case Manager checked in with all participants to review discharge dates, insurance authorizations, work-related documents and needs from the treatment team regarding medications. [name] stated needs and engaged in discussion.  ?  ?Initial Therapeutic Activity: Counselor facilitated a check-in with Baby to assess for safety, sobriety and medication compliance.  Counselor also inquired about [name]'s current emotional ratings, as well as any significant changes in thoughts, feelings or behavior since previous check in.  [name] presented for session on time and was alert, oriented x5, with no evidence or self-report of active SI/HI or A/V H.  [name] reported compliance with medication and denied use of alcohol or illicit substances.  [name] reported scores of 2/10 for depression, 6/10 for anxiety, and 0/10 for anger/irritability.  [name] denied any recent outbursts or panic attacks.  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  [name] reported that a recent struggle was having a nightmare last night which left her shaken up today.  [name] reported that her goal for today is to get her hair braided with her daughter.   ? ?Second Therapeutic Activity: Counselor  covered topic of attachment styles today.  Counselor virtually shared a handout with the group on this topic which defined attachment styles as how people think about and behave in relationships.  Styles were broken down by category, including secure attachment where one believes close relationships are trustworthy, compared to insecure attachment (i.e. anxious, avoidant, or anxious-avoidant) where one is distrusting or worries about their bond with others.  Counselor inquired about which attachment style members most related to, how this has influenced their mental health/well-being, and whether they intend to begin making any changes.  Intervention was effective, as evidenced by [name] participating in discussion, and reporting that she most identified with the anxious/avoidant attachment style based upon relatable traits such as having a tendency to fluctuate between emotional extremes, having trouble maintaining consistent boundaries in relationships, and experiencing high-conflict interactions with partners.  [name] reported that she believes her attachment style was greatly influenced by her upbringing, as her father was an abusive alcoholic who made her feel insecure and unsafe, and the family's tendency to travel often made it difficult to make meaningful friendships with other people.  [name] reported that she tends to be a 'people pleaser' as an adult, and neglect her own needs, stating ?I try to hold in my feelings until I can't anymore, and then explode?[name] reported that her goal to improve attachment is to stay single for the time being so that she can focus on her own needs, and learn to set healthier boundaries within relationships moving forward to break past patterns.  [name] stated ?I'm focused on not putting my daughter in the situation my parents put me in, and I've also started the process of getting rid of people  that don't support me in this journey too?.   ? ? ?Assessment  and Plan: ?[name] has completed MHIOP today and agreed to follow up with her therapist, and psychiatrist.  Counselor recommends adherence to crisis/safety plan, taking medications as prescribed, and following up with medical professionals if any issues arise. ?  ?Follow Up Instructions: ?Shaolin was advised to call back or seek an in-person evaluation if the symptoms worsen or if the condition fails to improve as anticipated. ?  ?Collaboration of Care:   Medication Management AEB [name], NP  ?                                         Case Manager AEB [name], CNA  ? ? ?Patient/Guardian was advised Release of Information must be obtained prior to any record release in order to collaborate their care with an outside provider. Patient/Guardian was advised if they have not already done so to contact the registration department to sign all necessary forms in order for [place] to release information regarding their care.  ? ?Consent: Patient/Guardian gives verbal consent for treatment and assignment of benefits for services provided during this visit. Patient/Guardian expressed understanding and agreed to proceed. ? ?I provided 180 minutes of non-face-to-face time during this encounter. ?  ?[name], LCSW, LCAS ?07/24/21  ?

## 2021-07-25 ENCOUNTER — Other Ambulatory Visit (HOSPITAL_COMMUNITY): Payer: 59 | Admitting: Licensed Clinical Social Worker

## 2021-07-25 DIAGNOSIS — F331 Major depressive disorder, recurrent, moderate: Secondary | ICD-10-CM

## 2021-07-25 DIAGNOSIS — F411 Generalized anxiety disorder: Secondary | ICD-10-CM | POA: Diagnosis not present

## 2021-07-25 NOTE — Progress Notes (Signed)
Virtual Visit via Video Note ?  ?I connected with Mackenzie Paddy. Mcgee on 07/25/21 at  9:00 AM EDT by a video enabled telemedicine application and verified that I am speaking with the correct person using two identifiers. ?  ?At orientation to the IOP program, Case Manager discussed the limitations of evaluation and management by telemedicine and the availability of in person appointments. The patient expressed understanding and agreed to proceed with virtual visits throughout the duration of the program. ?  ?Location:  ?Patient: Patient Home ?Provider: [facility] ?  ?History of Present Illness: ?MDD and GAD ?  ?Observations/Objective: ?Check In: Case Manager checked in with all participants to review discharge dates, insurance authorizations, work-related documents and needs from the treatment team regarding medications. Mackenzie Mcgee stated needs and engaged in discussion.  ?  ?Initial Therapeutic Activity: Counselor facilitated a check-in with Mackenzie Mcgee to assess for safety, sobriety and medication compliance.  Counselor also inquired about Mackenzie Mcgee's current emotional ratings, as well as any significant changes in thoughts, feelings or behavior since previous check in.  Mackenzie Mcgee presented for session on time and was alert, oriented x5, with no evidence or self-report of active SI/HI or A/V H.  Mackenzie Mcgee reported compliance with medication and denied use of alcohol or illicit substances.  Mackenzie Mcgee reported scores of 2/10 for depression, 1/10 for anxiety, and 0/10 for anger/irritability.  Mackenzie Mcgee denied any recent outbursts or panic attacks.  Mackenzie Mcgee reported that a recent success was sleeping more peacefully the night before, and having a good afternoon, stating ?Everything went well?Mackenzie Mcgee denied any new challenges.  Mackenzie Mcgee reported that her goal is to attend several birthday parties for her daughter's friends to stay busy.    ? ?Second Therapeutic Activity: Counselor introduced topic of  self-esteem today and defined this as the value an individual places on oneself, based upon assessment of personal worth as a human being and approval/disapproval of one's behavior. Counselor asked members to assess their level of self-esteem at this time based upon common indicators of high self-esteem, including: accepting oneself unconditionally;  having self-respect and deep seated belief that one matters; being unaffected by other people's opinions/criticisms; and showing good control over emotions.  Counselor also explained concept of one's inner critic which serves to highlight faults and minimize strengths, directly influencing low sense of self-esteem.  Counselor then provided handout on 'strengths and qualities', which featured questions to guide discussion and increase awareness of each member's unique individual abilities which could reinforce higher self-esteem. Examples of questions included: 'things I am good at', 'challenges I have overcome', and 'what I like about myself'.  Intervention was effective, as evidenced by Mackenzie Mcgee actively engaging in discussion on topic, and completing a self-esteem assessment, receiving a score that indicated a 'medium' level of self-esteem at this time due to positive traits such as assuming responsibility for her life and personal choices, being creative artistically, and taking calculated risks with confidence.  She reported that some negative traits did stand out, such as struggling to accept compliments at times or celebrate accomplishments, not always dealing with problems using healthy coping skills, and acting passively around others at times.  Mackenzie Mcgee was receptive to several strategies offered today for increasing self-esteem during treatment, including carving out more time in her schedule for self-care activities, recognizing personal strengths such as creativity, fairness, open mindedness ambition, intelligence, cooperation, and curiosity; asking for  feedback from people who love and appreciate her, taking time each day for indulgences such as taking a walk  or acting spontaneously, reciting positive affirmations, setting social media boundaries to reduce comparison to others, staying away from people that bring her down emotionally, going back to school to pursue a new rewarding career, and letting go of perfectionism.     ? ?Assessment and Plan: ?Mackenzie Mcgee has completed MHIOP today and agreed to follow up with her therapist, and psychiatrist.  Counselor recommends adherence to crisis/safety plan, taking medications as prescribed, and following up with medical professionals if any issues arise. ?  ?Follow Up Instructions: ?Mackenzie Mcgee was advised to call back or seek an in-person evaluation if the symptoms worsen or if the condition fails to improve as anticipated. ?  ?Collaboration of Care:   Medication Management AEB [name], NP  ?                                         Case Manager AEB [name], CNA  ? ? ?Patient/Guardian was advised Release of Information must be obtained prior to any record release in order to collaborate their care with an outside provider. Patient/Guardian was advised if they have not already done so to contact the registration department to sign all necessary forms in order for [place] to release information regarding their care.  ? ?Consent: Patient/Guardian gives verbal consent for treatment and assignment of benefits for services provided during this visit. Patient/Guardian expressed understanding and agreed to proceed. ? ?I provided 180 minutes of non-face-to-face time during this encounter. ?  ?[name], LCSW, LCAS ?07/25/21  ?

## 2021-07-27 ENCOUNTER — Encounter (HOSPITAL_COMMUNITY): Payer: Self-pay | Admitting: Family

## 2021-07-27 NOTE — Progress Notes (Signed)
Psychiatric Initial Adult Assessment  ? ?Patient Identification: Mackenzie Mcgee ?MRN:  [id number] ?Date of Evaluation:  07/27/2021 ?Referral Source: MD Arfeen  ?Chief Complaint reported feeling stressed,overwhelmed and decreased concentration ? ?Visit Diagnosis:  ?  ICD-10-CM   ?1. GAD (generalized anxiety disorder)  F41.1   ?  ?2. MDD (major depressive disorder), recurrent episode, moderate (HCC)  F33.1   ?  ? ? ?History of Present Illness: Mackenzie Mcgee is a 41 year old African-American female who is referred by attending psychiatrist Arfeen due to worsening depression and anxiety.  She reports multiple stresses with related to declining mental health states she has been experiencing irritable mood hopelessness and poor concentration.  States she is having a difficult time finding worklife balance states she has a [employer] and her employer has not been flexible with her requested time off. States she has been assigned more responsibilities.  ? ?Mackenzie Mcgee reports recent diagnosed with diabetes states has been managed with medication.  She reports caring for her mother who is currently disabled.   ? ?She denied previous inpatient admissions.  Denied illicit drug use or substance abuse history.  States her mother: has a history of anxiety depression and posttraumatic stress disorder. Father: was diagnosed with bipolar disorder.  Patient to start intensive outpatient programming on 07/21/2021 ? ?Associated Signs/Symptoms: ?Depression Symptoms:  depressed mood, ?feelings of worthlessness/guilt, ?hopelessness, ?weight loss, ?(Hypo) Manic Symptoms:  Distractibility, ?Anxiety Symptoms:  Excessive Worry, ?Psychotic Symptoms:  Hallucinations: None ?PTSD Symptoms: ?NA ? ?Past Psychiatric History:  ? ?Previous Psychotropic Medications: No  ? ?Substance Abuse History in the last 12 months:  Yes.   ? ?Consequences of Substance Abuse: ?NA ? ?Past Medical History:  ?Past Medical History:  ?Diagnosis Date  ?  COVID-19 virus infection 07/2020  ? Gestational diabetes   ? Morbid obesity ([place])   ? Pregnancy   ?  ?Past Surgical History:  ?Procedure Laterality Date  ? HERNIA REPAIR    ? Refugio EXTRACTION  2014  ? ? ?Family Psychiatric History:  ? ?Family History:  ?Family History  ?Problem Relation Age of Onset  ? Healthy Mother   ? Depression Mother   ? Alcohol abuse Father   ? Depression Father   ? Drug abuse Father   ? ? ?Social History:   ?Social History  ? ?Socioeconomic History  ? Marital status: Single  ?  Spouse name: Not on file  ? Number of children: 1  ? Years of education: Not on file  ? Highest education level: Not on file  ?Occupational History  ? Not on file  ?Tobacco Use  ? Smoking status: Former  ?  Packs/day: 0.50  ?  Years: 4.00  ?  Pack years: 2.00  ?  Types: Cigarettes  ? Smokeless tobacco: Never  ?Vaping Use  ? Vaping Use: Never used  ?Substance and Sexual Activity  ? Alcohol use: Not Currently  ? Drug use: No  ? Sexual activity: Yes  ?  Birth control/protection: None  ?Other Topics Concern  ? Not on file  ?Social History Narrative  ? Not on file  ? ?Social Determinants of Health  ? ?Financial Resource Strain: Not on file  ?Food Insecurity: Not on file  ?Transportation Needs: Not on file  ?Physical Activity: Not on file  ?Stress: Not on file  ?Social Connections: Not on file  ? ? ?Additional Social History:  ? ?Allergies:  No Known Allergies ? ?Metabolic Disorder Labs: ?Lab Results  ?Component Value Date  ? HGBA1C 5.8  06/25/2021  ? MPG 269 09/17/2020  ? ?No results found for: PROLACTIN ?Lab Results  ?Component Value Date  ? CHOL 163 06/25/2021  ? TRIG 60.0 06/25/2021  ? HDL 56.30 06/25/2021  ? CHOLHDL 3 06/25/2021  ? VLDL 12.0 06/25/2021  ? LDLCALC 95 06/25/2021  ? ?No results found for: TSH ? ?Therapeutic Level Labs: ?No results found for: LITHIUM ?No results found for: CBMZ ?No results found for: VALPROATE ? ?Current Medications: ?Current Outpatient Medications  ?Medication Sig Dispense Refill  ?  aspirin EC 81 MG EC tablet Take 1 tablet (81 mg total) by mouth daily. Swallow whole. 30 tablet 11  ? blood glucose meter kit and supplies KIT Dispense based on patient and insurance preference. Use up to four times daily as directed. 1 each 0  ? buPROPion (WELLBUTRIN XL) 150 MG 24 hr tablet Take 1 tablet (150 mg total) by mouth daily. 30 tablet 0  ? busPIRone (BUSPAR) 7.5 MG tablet Take 1 tablet (7.5 mg total) by mouth 2 (two) times daily. 60 tablet 2  ? citalopram (CELEXA) 20 MG tablet Take 1 tablet (20 mg total) by mouth daily. 30 tablet 3  ? hydrOXYzine (ATARAX) 25 MG tablet Take 1-3 tablets (25-75 mg total) by mouth every 8 (eight) hours as needed for anxiety. 60 tablet 1  ? Lancets (ONETOUCH DELICA PLUS LANCET33G) MISC USE AS DIRECTED UP TO FOUR  TIMES  DAILY 100 each 6  ? ONETOUCH VERIO test strip USE AS DIRECTED UP TO FOUR  TIMES  DAILY 100 each 0  ? pantoprazole (PROTONIX) 40 MG tablet Take 1 tablet (40 mg total) by mouth daily. 30 tablet 1  ? tirzepatide (MOUNJARO) 5 MG/0.5ML Pen INJECT 1 PEN SUBCUTANEOUSLY ONCE A WEEK 12 mL 2  ? triamcinolone cream (KENALOG) 0.1 % Apply 1 application topically 2 (two) times daily. 30 g 0  ? ?No current facility-administered medications for this visit.  ? ? ?Musculoskeletal: ?Strength & Muscle Tone: within normal limits ?Gait & Station: normal ?Patient leans: N/A ? ?Psychiatric Specialty Exam: ?Review of Systems  ?unknown if currently breastfeeding.There is no height or weight on file to calculate BMI.  ?General Appearance: Casual  ?Eye Contact:  Good  ?Speech:  Clear and Coherent  ?Volume:  Increased  ?Mood:  Anxious  ?Affect:  Congruent  ?Thought Process:  Coherent  ?Orientation:  Full (Time, Place, and Person)  ?Thought Content:  Logical  ?Suicidal Thoughts:  No  ?Homicidal Thoughts:  No  ?Memory:  Immediate;   Good ?Recent;   Good  ?Judgement:  Fair  ?Insight:  Good  ?Psychomotor Activity:  Normal  ?Concentration:  Concentration: Good  ?Recall:  Good  ?Fund of  Knowledge:Good  ?Language: Fair  ?Akathisia:  Yes  ?Handed:  Right  ?AIMS (if indicated):  done  ?Assets:  Communication Skills ?Desire for Improvement  ?ADL's:  Intact  ?Cognition: WNL  ?Sleep:  Good  ? ?Screenings: ?PHQ2-9   ? ?Flowsheet Row Counselor from 07/17/2021 in [facility]ENSIVE PSYCH Office Visit from 06/25/2021 in [facility] Office Visit from 09/25/2020 in [facility]  ?PHQ-2 Total Score 3 4 0  ?PHQ-9 Total Score 14 11 --  ? ?  ? ?Flowsheet Row Counselor from 07/17/2021 in BEHAVIORAL HEALTH INTENSIVE PSYCH ?Most recent reading at 07/17/2021  9:43 AM ED to Hosp-Admission (Discharged) from 09/16/2020 in [place] 4TH FLOOR PROGRESSIVE CARE AND UROLOGY ?Most recent reading at 09/17/2020 10:36 AM ED   from 09/16/2020 in [place] ?Most recent reading at 09/16/2020 12:02 AM  ?C-SSRS RISK CATEGORY Error: Question 6 not populated No Risk No Risk  ? ?  ? ? ?Assessment and Plan:  ?Start intensive outpatient programming ?Continue medications as indicated ? ?Collaboration of Care: Psychiatrist AEB Afreen S ? ?Patient/Guardian was advised Release of Information must be obtained prior to any record release in order to collaborate their care with an outside provider. Patient/Guardian was advised if they have not already done so to contact the registration department to sign all necessary forms in order for [place] to release information regarding their care.  ? ?Consent: Patient/Guardian gives verbal consent for treatment and assignment of benefits for services provided during this visit. Patient/Guardian expressed understanding and agreed to proceed.  ? ?Derrill Center, NP ?5/7/20233:25 PM ? ?

## 2021-07-28 ENCOUNTER — Other Ambulatory Visit (HOSPITAL_COMMUNITY): Payer: 59 | Admitting: Professional

## 2021-07-28 DIAGNOSIS — F339 Major depressive disorder, recurrent, unspecified: Secondary | ICD-10-CM

## 2021-07-28 DIAGNOSIS — F411 Generalized anxiety disorder: Secondary | ICD-10-CM

## 2021-07-29 ENCOUNTER — Other Ambulatory Visit (HOSPITAL_COMMUNITY): Payer: 59 | Admitting: Psychiatry

## 2021-07-29 NOTE — Progress Notes (Signed)
Virtual Visit via Video Note ?  ?I connected with Mackenzie Mcgee. Deterding on 07/25/21 at  9:00 AM EDT by a video enabled telemedicine application and verified that I am speaking with the correct person using two identifiers. ?  ?At orientation to the IOP program, Case Manager discussed the limitations of evaluation and management by telemedicine and the availability of in person appointments. The patient expressed understanding and agreed to proceed with virtual visits throughout the duration of the program. ?  ?Location:  ?Patient: Patient Home ?Provider: [facility] ?  ?History of Present Illness: ?MDD and GAD ?  ?Observations/Objective: ?Check In: Case Manager checked in with all participants to review discharge dates, insurance authorizations, work-related documents and needs from the treatment team regarding medications. Shaguana stated needs and engaged in discussion.  ?  ?Initial Therapeutic Activity: Counselor facilitated a check-in with [name] to assess for safety, sobriety and medication compliance.  Counselor also inquired about [name]'s current emotional ratings, as well as any significant changes in thoughts, feelings or behavior since previous check in.  [name] presented for session on time and was alert, oriented x5, with no evidence or self-report of active SI/HI or A/V H.  [name] reported compliance with medication and denied use of alcohol or illicit substances.   Patient rates her mood at a 9 on a scale of 1-10 with 10 being great. Pt reports she spent the weekend with her daughter going to multiple birthday parties. Pt reports she is struggling with staying present and mindful. Patient able to process. Patient engaged in discussion    ? ?Second Therapeutic Activity: Clinician introduced "Mindfulness". Group discussed "What" and "How" skills of mindfulness. Patients identified what types of activities would help them practice mindfulness. Group discussed practicing mindfulness  during "autopilot" activities such as driving, showering, eating, or doing chores. Group identified timed-practice skills for mindfulness such as doing puzzles, coloring, meditation, PMR, and 5-4-3-2-1. ? ?Assessment and Plan: ?[name] has completed MHIOP today and agreed to follow up with her therapist, and psychiatrist.  Counselor recommends adherence to crisis/safety plan, taking medications as prescribed, and following up with medical professionals if any issues arise. ?  ?Follow Up Instructions: ?[name] was advised to call back or seek an in-person evaluation if the symptoms worsen or if the condition fails to improve as anticipated. ?  ?Collaboration of Care:   Case Manager AEB [name], CNA  ? ? ?Patient/Guardian was advised Release of Information must be obtained prior to any record release in order to collaborate their care with an outside provider. Patient/Guardian was advised if they have not already done so to contact the registration department to sign all necessary forms in order for [place] to release information regarding their care.  ? ?Consent: Patient/Guardian gives verbal consent for treatment and assignment of benefits for services provided during this visit. Patient/Guardian expressed understanding and agreed to proceed. ? ?I provided 180 minutes of non-face-to-face time during this encounter. ?  ?Milana Na, [facility] ?07/28/21 ?

## 2021-07-30 ENCOUNTER — Other Ambulatory Visit (HOSPITAL_COMMUNITY): Payer: 59

## 2021-07-31 ENCOUNTER — Other Ambulatory Visit (HOSPITAL_COMMUNITY): Payer: 59 | Admitting: Psychiatry

## 2021-08-01 ENCOUNTER — Other Ambulatory Visit (HOSPITAL_COMMUNITY): Payer: 59

## 2021-08-04 ENCOUNTER — Other Ambulatory Visit (HOSPITAL_COMMUNITY): Payer: 59 | Admitting: Licensed Clinical Social Worker

## 2021-08-04 DIAGNOSIS — F411 Generalized anxiety disorder: Secondary | ICD-10-CM

## 2021-08-04 DIAGNOSIS — F331 Major depressive disorder, recurrent, moderate: Secondary | ICD-10-CM

## 2021-08-04 NOTE — Progress Notes (Signed)
Virtual Visit via Video Note ?  ?I connected with Elenore Paddy. Monjaras on 08/04/21 at  9:00 AM EDT by a video enabled telemedicine application and verified that I am speaking with the correct person using two identifiers. ?  ?At orientation to the IOP program, Case Manager discussed the limitations of evaluation and management by telemedicine and the availability of in person appointments. The patient expressed understanding and agreed to proceed with virtual visits throughout the duration of the program. ?  ?Location:  ?Patient: Patient Home ?Provider: [facility] ?  ?History of Present Illness: ?MDD and GAD ?  ?Observations/Objective: ?Check In: Case Manager checked in with all participants to review discharge dates, insurance authorizations, work-related documents and needs from the treatment team regarding medications. Licet stated needs and engaged in discussion.  ?  ?Initial Therapeutic Activity: Counselor facilitated a check-in with Taylia to assess for safety, sobriety and medication compliance.  Counselor also inquired about Chidera's current emotional ratings, as well as any significant changes in thoughts, feelings or behavior since previous check in.  Vasilisa presented for session on time and was alert, oriented x5, with no evidence or self-report of active SI/HI or A/V H.  Breeona reported compliance with medication and denied use of alcohol or illicit substances.  Nena reported scores of 0/10 for depression, 0/10 for anxiety, and 0/10 for anger/irritability.  Baley denied any recent outbursts or panic attacks.  Kryssa reported that a recent success was attending field day for school with her daughter and celebrating a good mother's day with family.  Tajanae reported that a recent struggle has been dealing with a sore throat and low grade fever today which she believes she picked up from her daughter.  Omaya reported that her goal today is to outreach her doctor about  these symptoms and take time to rest.     ? ?Second Therapeutic Activity: Counselor introduced topic of stress management today.  Counselor provided definition of stress as feeling tense, overwhelmed, worn out, and/or exhausted, and noted that in small amounts, stress can be motivating until things become too overwhelming to manage.  Counselor also explained how stress can be acute (brief but intense) or chronic (long-lasting) and this can impact the severity of symptoms one can experience in the physical, emotional, and behavioral categories.  Counselor inquired about members' specific stressors, how long they have been prevalent, and the various symptoms that tend to manifest as a result.  Counselor also offered several stress management strategies to help improve members' coping ability, including journaling, gratitude practice, relaxation techniques, and time management tips.  Counselor also explained that research has shown a strong support network composed of trusted family, friends, or community members can increase resilience in times of stress, and inquired about who members can reach out to for help in managing stressors.  Counselor encouraged members to consider discussing stressor 'red flags' with their close supports that can be monitored and strategies for assisting them in times of crisis.  Intervention was effective, as evidenced by [name] actively participating in discussion on subject, reporting that her most significant stressors include work stress and conflict with family.  Rahmah was able to identify several warning signs related to stress, including decreased immunity, worsening depression, and sleep problems.  Byanca reported that her stress management goal is to keep her average stress level below a 3/10 while at work by utilizing relaxation skills during shifts.  Arayla also expressed receptiveness to several stress management strategies practiced today in session, including  practicing deep breathing, and increasing insight into stressors by keeping a daily stress tracker.  Ginnifer reported that she needed to leave group early today due to worsening nausea.  Counselor was agreeable to this and informed care team of her early departure.    ? ?Assessment and Plan: ?Counselor recommends that [place] remain in IOP treatment to better manage mental health symptoms, ensure stability and pursue completion of treatment plan goals. Counselor recommends adherence to crisis/safety plan, taking medications as prescribed, and following up with medical professionals if any issues arise. ?  ?Follow Up Instructions: ?Counselor will send Webex link for next session. Tajai was advised to call back or seek an in-person evaluation if the symptoms worsen or if the condition fails to improve as anticipated. ?  ?Collaboration of Care:   Medication Management AEB [name], NP  ?                                         Case Manager AEB [name], CNA  ? ? ?Patient/Guardian was advised Release of Information must be obtained prior to any record release in order to collaborate their care with an outside provider. Patient/Guardian was advised if they have not already done so to contact the registration department to sign all necessary forms in order for [place] to release information regarding their care.  ? ?Consent: Patient/Guardian gives verbal consent for treatment and assignment of benefits for services provided during this visit. Patient/Guardian expressed understanding and agreed to proceed. ? ?I provided 90 minutes of non-face-to-face time during this encounter. ?  ?[name], LCSW, LCAS ?08/04/21  ?

## 2021-08-05 ENCOUNTER — Other Ambulatory Visit (HOSPITAL_BASED_OUTPATIENT_CLINIC_OR_DEPARTMENT_OTHER): Payer: 59 | Admitting: Licensed Clinical Social Worker

## 2021-08-05 DIAGNOSIS — F331 Major depressive disorder, recurrent, moderate: Secondary | ICD-10-CM | POA: Diagnosis not present

## 2021-08-05 DIAGNOSIS — F411 Generalized anxiety disorder: Secondary | ICD-10-CM

## 2021-08-05 NOTE — Progress Notes (Signed)
Virtual Visit via Video Note ? ?I connected with Mackenzie Mcgee on 08/05/21 at 9:00am by video enabled telemedicine application and verified that I am speaking with the correct person using two identifiers. ?  ?I discussed the limitations, risks, security and privacy concerns of performing an evaluation and management service by video and the availability of in person appointments. I also discussed with the patient that there may be a patient responsible charge related to this service. The patient expressed understanding and agreed to proceed. ?  ?I discussed the assessment and treatment plan with the patient. The patient was provided an opportunity to ask questions and all were answered. The patient agreed with the plan and demonstrated an understanding of the instructions. ?  ?The patient was advised to call back or seek an in-person evaluation if the symptoms worsen or if the condition fails to improve as anticipated. ?  ?I provided 1 hour of non-face-to-face time during this encounter. ?  ?Shade Flood, LCSW, LCAS ?________________________ ?THERAPIST PROGRESS NOTE ?  ?Session Time: 9:00am - 10:00am    ? ?Location: ?Patient: Patient Home ?Provider: [facility]   ?  ?Participation Level: Active ?  ?Behavioral Response: Alert, casually dressed, irritable mood/affect ?  ?Type of Therapy:  Individual Therapy ?  ?Treatment Goals addressed: Depression and Anxiety management; Medication management ? ?Progress Towards Goals: Progressing  ?  ?Interventions: CBT; progressive muscle relaxation  ?  ?Summary: Mackenzie Mcgee is a 41 year old AA female that presented for therapy appointment and is diagnosed with Major Depressive Disorder, recurrent, moderate and Generalized Anxiety Disorder.      ?  ?Suicidal/Homicidal: None; without intent or plan ?  ?Therapist Response: Clinician met with Mackenzie Mcgee for individual virtual therapy session today, as [facility] group census was low. Clinician assessed for safety,  sobriety, and medication compliance.  Mackenzie Mcgee presented for appointment on time and was alert, oriented x5, with no evidence or self-report of active SI/HI or A/V H.  Mackenzie Mcgee denied any use of alcohol or illicit substances.  She reported compliance with current medications.  Clinician inquired about Mackenzie Mcgee's emotional ratings today, and whether she has had any significant changes in thoughts, feelings, or behavior since last check-in.  Mackenzie Mcgee reported scores of 2/10 for depression, 2/10 for anxiety, and 6/10 for anger/irritability.  Mackenzie Mcgee denied any outbursts.  She reported that a struggle was 'almost' having a panic attack due to how she was feeling yesterday, reporting that she did speak with her doctor about symptoms that led her to leave group early, and he believes this was related to an allergy shot. Mackenzie Mcgee reported that she has been feeling more stressed recently, and believes this has contributed to muscle tension.  Clinician provided psychoeducation on link between tension in the body's muscle groups and emotional stress, and offered to guide Mackenzie Mcgee through progressive muscle relaxation exercise today to assist.  She was agreeable to this, so clinician coached Mackenzie Mcgee through process of going through each major muscle group of the body (i.e. arms, legs, neck, shoulders, etc), tensing muscles for 5 seconds, and then releasing tension while mentally stating "Relax" to provide relief from related stress.  Intervention was effective, as evidenced by Mackenzie Mcgee participating in activity successfully and reported that this not only increased insight into muscle groups where she was retaining stress such as neck, jaw, legs, and feet but also decreased overall tension in these areas, stating ?I definitely felt some relief.  I'm totally going to start doing this each day?.  Clinician  will continue to monitor.   ?      ?Plan: Meet again tomorrow for next virtual group session.  ? ?Diagnosis:  Major Depressive Disorder, recurrent, moderate and Generalized Anxiety Disorder.  ? ?Collaboration of Care:   Medication Management AEB [name], NP  ?                                         Case Manager AEB Dellia Nims, CNA ?                                                ?Patient/Guardian was advised Release of Information must be obtained prior to any record release in order to collaborate their care with an outside provider. Patient/Guardian was advised if they have not already done so to contact the registration department to sign all necessary forms in order for [place] to release information regarding their care.  ?  ?Consent: Patient/Guardian gives verbal consent for treatment and assignment of benefits for services provided during this visit. Patient/Guardian expressed understanding and agreed to proceed. ? ?Shade Flood, LCSW, LCAS ?08/05/21   ?

## 2021-08-06 ENCOUNTER — Encounter (HOSPITAL_COMMUNITY): Payer: Self-pay

## 2021-08-06 ENCOUNTER — Other Ambulatory Visit (HOSPITAL_COMMUNITY): Payer: 59 | Admitting: Psychiatry

## 2021-08-06 ENCOUNTER — Telehealth (HOSPITAL_COMMUNITY): Payer: 59 | Admitting: Psychiatry

## 2021-08-06 DIAGNOSIS — F411 Generalized anxiety disorder: Secondary | ICD-10-CM

## 2021-08-06 DIAGNOSIS — F331 Major depressive disorder, recurrent, moderate: Secondary | ICD-10-CM

## 2021-08-06 NOTE — Progress Notes (Signed)
Virtual Visit via Video Note ?  ?I connected with Mackenzie Mcgee. Mackenzie Mcgee on 08/06/21 at  9:00 AM EDT by a video enabled telemedicine application and verified that I am speaking with the correct person using two identifiers. ?  ?At orientation to the IOP program, Case Manager discussed the limitations of evaluation and management by telemedicine and the availability of in person appointments. The patient expressed understanding and agreed to proceed with virtual visits throughout the duration of the program. ?  ?Location:  ?Patient: Patient Home ?Provider: [facility] ?  ?History of Present Illness: ?MDD and GAD ?  ?Observations/Objective: ?Check In: Case Manager checked in with all participants to review discharge dates, insurance authorizations, work-related documents and needs from the treatment team regarding medications. Burnis stated needs and engaged in discussion.  ?  ?Initial Therapeutic Activity: Counselor facilitated a check-in with Mackenzie Mcgee to assess for safety, sobriety and medication compliance.  Counselor also inquired about Mackenzie Mcgee's current emotional ratings, as well as any significant changes in thoughts, feelings or behavior since previous check in.  Mackenzie Mcgee presented for session on time and was alert, oriented x5, with no evidence or self-report of active SI/HI or A/V H.  Mackenzie Mcgee reported compliance with medication and denied use of alcohol or illicit substances.  Mackenzie Mcgee reported scores of 0/10 for depression, 0/10 for anxiety, and 0/10 for anger/irritability.  Harvey denied any recent outbursts or panic attacks.  Ronni reported that a recent success was spending time with her daughter yesterday watching TV together.  Mackenzie Mcgee denied any new challenges.  Mackenzie Mcgee reported that her goal today is to do some landscaping in the yard.     ? ?Second Therapeutic Activity: Counselor introduced Mackenzie Mcgee, [employer] Pharmacist, to provide psychoeducation on topic of medication  compliance with members today.  Mackenzie Mcgee provided psychoeducation on classes of medications such as antidepressants, antipsychotics, what symptoms they are intended to treat, and any side effects one might encounter while on a particular prescription.  Time was allowed for clients to ask any questions they might have of [facility] regarding this specialty.  Intervention was effective, as evidenced by Mackenzie Mcgee participating in discussion with speaker on the subject, inquiring about information on Wellbutrin, which her psychiatrist recently began prescribing to her.  Mackenzie Mcgee was receptive to feedback from pharmacist on how this medication is intended to address depression, potential side effects, and importance of compliance with medication as prescribed by provider.   ? ?Third Therapeutic Activity: Psycho-educational portion of group was co-facilitated by wellness director (David Stall, MS, MPH, CHES) focused on self-care in daily life. Facilitator and group members discussed presented materials regarding importance of sleep, diet, and exercise. Group members discussed any changes they are willing to make to improve an area of self-care in their lives (physical, psychological, emotional, spiritual, relationship, professional) to improve overall mental health as they continue with treatment.  Intervention was effective, as evidenced by Mackenzie Mcgee participating in discussion with speaker on the subject, reporting that one aspect of self-care she has been putting more emphasis on during treatment is exercise, noting that walking around the neighborhood has been good for mental and physical health, and offered the chance to connect with positive supports who share similar interests, and could provide encouragement and accountability towards progressing with health wellness goal.   ? ?Assessment and Plan: ?Counselor recommends that [place] remain in IOP treatment to better manage mental health symptoms, ensure stability and  pursue completion of treatment plan goals. Counselor recommends adherence to crisis/safety plan, taking medications as  prescribed, and following up with medical professionals if any issues arise. ?  ?Follow Up Instructions: ?Counselor will send Webex link for next session. Chea was advised to call back or seek an in-person evaluation if the symptoms worsen or if the condition fails to improve as anticipated. ?  ?Collaboration of Care:   Medication Management AEB Mackenzie Jacks, NP  ?                                         Case Manager AEB Mackenzie Modena, CNA  ? ? ?Patient/Guardian was advised Release of Information must be obtained prior to any record release in order to collaborate their care with an outside provider. Patient/Guardian was advised if they have not already done so to contact the registration department to sign all necessary forms in order for Mackenzie Mcgee to release information regarding their care.  ? ?Consent: Patient/Guardian gives verbal consent for treatment and assignment of benefits for services provided during this visit. Patient/Guardian expressed understanding and agreed to proceed. ? ?I provided 180 minutes of non-face-to-face time during this encounter. ?  ?Mackenzie Stain, LCSW, LCAS ?08/06/21  ?

## 2021-08-07 ENCOUNTER — Ambulatory Visit (HOSPITAL_COMMUNITY): Payer: 59

## 2021-08-08 ENCOUNTER — Other Ambulatory Visit (HOSPITAL_COMMUNITY): Payer: 59 | Admitting: Licensed Clinical Social Worker

## 2021-08-08 DIAGNOSIS — F411 Generalized anxiety disorder: Secondary | ICD-10-CM | POA: Diagnosis not present

## 2021-08-08 DIAGNOSIS — F331 Major depressive disorder, recurrent, moderate: Secondary | ICD-10-CM

## 2021-08-08 NOTE — Progress Notes (Signed)
Virtual Visit via Video Note   I connected with Mackenzie Mcgee on 08/08/21 at  9:00 AM EDT by a video enabled telemedicine application and verified that I am speaking with the correct person using two identifiers.   At orientation to the IOP program, Case Manager discussed the limitations of evaluation and management by telemedicine and the availability of in person appointments. The patient expressed understanding and agreed to proceed with virtual visits throughout the duration of the program.   Location:  Patient: Patient Home Provider: [facility]   History of Present Illness: MDD and GAD   Observations/Objective: Check In: Case Manager checked in with all participants to review discharge dates, insurance authorizations, work-related documents and needs from the treatment team regarding medications. Mackenzie Mcgee stated needs and engaged in discussion.    Initial Therapeutic Activity: Counselor facilitated a check-in with Mackenzie Mcgee to assess for safety, sobriety and medication compliance.  Counselor also inquired about Mackenzie Mcgee's current emotional ratings, as well as any significant changes in thoughts, feelings or behavior since previous check in.  Mackenzie Mcgee presented for session on time and was alert, oriented x5, with no evidence or self-report of active SI/HI or A/V H.  Mackenzie Mcgee reported compliance with medication and denied use of alcohol or illicit substances.  Mackenzie Mcgee reported scores of 0/10 for depression, 5/10 for anxiety, and 0/10 for anger/irritability.  Mackenzie Mcgee denied any recent outbursts or panic attacks.  Mackenzie Mcgee reported that a recent success was learning some good news about her boyfriend's health.  Mackenzie Mcgee reported that a recent struggle was having to be absent from group yesterday in order to help her father with an appointment.  Mackenzie Mcgee reported that her goal today/this weekend is to take her daughter to a dance class, and try to get some rest.       Second  Therapeutic Activity: Counselor introduced topic of self-care today.  Counselor explained how this can be defined as the things one does to maintain good health and improve well-being.  Counselor provided members with a self-care assessment form to complete.  This handout featured various sub-categories of self-care, including physical, psychological/emotional, social, spiritual, and professional.  Members were asked to rank their engagement in the activities listed for each dimension on a scale of 1-3, with 1 indicating 'Poor', 2 indicating 'Ok', and 3 indicating 'Well'.  Counselor invited members to share results of their assessment, and inquired about which areas of self-care they are doing well in, as well as areas that require attention, and how they plan to begin addressing this during treatment.  Intervention was effective, as evidenced by Mackenzie Mcgee successfully completing initial 2 sections of assessment and actively engaging in discussion on subject, reporting that she is excelling in areas such as maintaining personal hygiene, wearing clothes that make her feel good, attending preventative medical appointments, participating in fun activities, learning new things unrelated to work or school, going on vacations or daytrips, and finding reasons to laugh, but would benefit from focusing more on areas such as eating healthy foods, exercising regularly, getting enough sleep, resting when sick, eating regularly, taking time off from work or school, participating in more hobbies, expressing feelings in healthier ways, recognizing own strengths and achievements, doing comforting things, and talking about personal problems with supports.  Mackenzie Mcgee reported that she would work to improve self-care deficits by cooking more healthy meals at home with natural ingredients, increase time spent exercising throughout the week, meditate before bed in order to fall asleep faster, being more mindful of warning  signs that  indicate she needs to take a break from work, getting back into creative writing or reading as a hobby, and working in garden as comforting activity.    Assessment and Plan: Counselor recommends that Mackenzie Mcgee remain in IOP treatment to better manage mental health symptoms, ensure stability and pursue completion of treatment plan goals. Counselor recommends adherence to crisis/safety plan, taking medications as prescribed, and following up with medical professionals if any issues arise.   Follow Up Instructions: Counselor will send Webex link for next session. [name] was advised to call back or seek an in-person evaluation if the symptoms worsen or if the condition fails to improve as anticipated.   Collaboration of Care:   Medication Management AEB [name], NP                                           Case Manager AEB [name], CNA    Patient/Guardian was advised Release of Information must be obtained prior to any record release in order to collaborate their care with an outside provider. Patient/Guardian was advised if they have not already done so to contact the registration department to sign all necessary forms in order for [place] to release information regarding their care.   Consent: Patient/Guardian gives verbal consent for treatment and assignment of benefits for services provided during this visit. Patient/Guardian expressed understanding and agreed to proceed.  I provided 180 minutes of non-face-to-face time during this encounter.   [name], LCSW, LCAS 08/08/21

## 2021-08-11 ENCOUNTER — Other Ambulatory Visit (HOSPITAL_COMMUNITY): Payer: 59 | Admitting: Licensed Clinical Social Worker

## 2021-08-11 DIAGNOSIS — F411 Generalized anxiety disorder: Secondary | ICD-10-CM

## 2021-08-11 DIAGNOSIS — F331 Major depressive disorder, recurrent, moderate: Secondary | ICD-10-CM

## 2021-08-11 NOTE — Progress Notes (Signed)
Virtual Visit via Video Note   I connected with Mackenzie Paddy. Mcgee on 08/11/21 at  9:00 AM EDT by a video enabled telemedicine application and verified that I am speaking with the correct person using two identifiers.   At orientation to the IOP program, Case Manager discussed the limitations of evaluation and management by telemedicine and the availability of in person appointments. The patient expressed understanding and agreed to proceed with virtual visits throughout the duration of the program.   Location:  Patient: Patient Home Provider: Counselor Home Office   History of Present Illness: MDD and GAD   Observations/Objective: Check In: Case Manager checked in with all participants to review discharge dates, insurance authorizations, work-related documents and needs from the treatment team regarding medications. Mackenzie Mcgee stated needs and engaged in discussion.    Initial Therapeutic Activity: Counselor facilitated a check-in with Mackenzie Mcgee to assess for safety, sobriety and medication compliance.  Counselor also inquired about Mackenzie Mcgee's current emotional ratings, as well as any significant changes in thoughts, feelings or behavior since previous check in.  Mackenzie Mcgee presented for session on time and was alert, oriented x5, with no evidence or self-report of active SI/HI or A/V H.  Mackenzie Mcgee reported compliance with medication and denied use of alcohol or illicit substances.  Mackenzie Mcgee reported scores of 1/10 for depression, 1/10 for anxiety, and 0/10 for anger/irritability.  Mackenzie Mcgee denied any recent outbursts or panic attacks.  Mackenzie Mcgee reported that a recent success was taking her daughter to a dance class and enjoying watching her practice.  She reported that another success was being assertive about her husband backing out of plans that were made, stating "I was proud of myself for that".  Mackenzie Mcgee denied any additional struggles.  Mackenzie Mcgee reported that her goal today is to pick  up some supplies from the store.       Second Therapeutic Activity: Counselor provided psychoeducation on subject of boundaries with group members today using a virtual handout.  This handout defined boundaries as the limits and rules that we set for ourselves within relationships, and featured a breakdown of the 3 common categories of boundaries (i.e. porous, rigid, and healthy), along with typical traits specific to each one for easy identification.  It was noted that most people have a mixture of different boundary types depending on setting, person, and culture.  Additional information was provided on the types of boundaries (i.e. physical, intellectual, emotional, sexual, material, and time) within relationships, and what could be considered healthy versus unhealthy. Counselor tasked members with identifying what types of boundaries they presently hold within her own support systems, the collective impact these boundaries have upon their mental health, and changes that could be made in order to more effectively communicate individual mental health needs.  Intervention was effective, as evidenced by Mackenzie Mcgee actively engaging in discussion on topic, reporting that she has porous boundaries due to traits such as having difficulty saying "No" to the requests of others, becoming overinvolved in other's problems, dependent upon the opinions of others, accepting of abuse or disrespect, and fearing rejection if she does not comply with other's requests.  Mackenzie Mcgee reported that this developed early in childhood due to being in a codependent household with an alcoholic parent, and stated "I was like this as a child, always wanting to make sure people were happy and taken care of".  Mackenzie Mcgee reported that she would work to improve boundaries by developing assertive communication skills so that she can more directly address problems with supports and strengthen  understanding between them, rather than cutting them off  without explanation to avoid conflict.    Assessment and Plan: Counselor recommends that Mackenzie Mcgee remain in IOP treatment to better manage mental health symptoms, ensure stability and pursue completion of treatment plan goals. Counselor recommends adherence to crisis/safety plan, taking medications as prescribed, and following up with medical professionals if any issues arise.   Follow Up Instructions: Counselor will send Webex link for next session. Mackenzie Mcgee was advised to call back or seek an in-person evaluation if the symptoms worsen or if the condition fails to improve as anticipated.   Collaboration of Care:   Medication Management AEB [name], NP                                           Case Manager AEB [name], CNA    Patient/Guardian was advised Release of Information must be obtained prior to any record release in order to collaborate their care with an outside provider. Patient/Guardian was advised if they have not already done so to contact the registration department to sign all necessary forms in order for [place] to release information regarding their care.   Consent: Patient/Guardian gives verbal consent for treatment and assignment of benefits for services provided during this visit. Patient/Guardian expressed understanding and agreed to proceed.  I provided 180 minutes of non-face-to-face time during this encounter.   [name], LCSW, LCAS 08/11/21

## 2021-08-12 ENCOUNTER — Other Ambulatory Visit (HOSPITAL_COMMUNITY): Payer: 59 | Admitting: Licensed Clinical Social Worker

## 2021-08-12 DIAGNOSIS — F411 Generalized anxiety disorder: Secondary | ICD-10-CM | POA: Diagnosis not present

## 2021-08-12 DIAGNOSIS — F331 Major depressive disorder, recurrent, moderate: Secondary | ICD-10-CM

## 2021-08-12 NOTE — Progress Notes (Signed)
Virtual Visit via Video Note   I connected with Mackenzie Mcgee on 08/12/21 at  9:00 AM EDT by a video enabled telemedicine application and verified that I am speaking with the correct person using two identifiers.   At orientation to the IOP program, Case Manager discussed the limitations of evaluation and management by telemedicine and the availability of in person appointments. The patient expressed understanding and agreed to proceed with virtual visits throughout the duration of the program.   Location:  Patient: Patient Home Provider: [facility]   History of Present Illness: MDD and GAD   Observations/Objective: Check In: Case Manager checked in with all participants to review discharge dates, insurance authorizations, work-related documents and needs from the treatment team regarding medications. Mackenzie Mcgee stated needs and engaged in discussion.    Initial Therapeutic Activity: Counselor facilitated a check-in with Mackenzie Mcgee to assess for safety, sobriety and medication compliance.  Counselor also inquired about Mackenzie Mcgee's current emotional ratings, as well as any significant changes in thoughts, feelings or behavior since previous check in.  Mackenzie Mcgee presented for session on time and was alert, oriented x5, with no evidence or self-report of active SI/HI or A/V H.  Mackenzie Mcgee reported compliance with medication and denied use of alcohol or illicit substances.  Mackenzie Mcgee reported scores of 3/10 for depression, 3/10 for anxiety, and 2/10 for irritability.  Mackenzie Mcgee denied any recent outbursts or panic attacks.  Mackenzie Mcgee reported that a recent success was picking up supplies for a party at her daughter's school today.  Mackenzie Mcgee reported that a current struggle is dealing with slight irritation today related to concern about a bully her daughter is dealing with in class.  Mackenzie Mcgee reported that her goal today is to attend an end of year celebration for her daughter this afternoon.      Second Therapeutic Activity: Second Therapeutic Activity: Counselor introduced [employer] Cha[facility] to provide psychoeducation on topic of Grief and Loss with members today.  Mackenzie Mcgee began discussion by checking in with the group about their baseline mood today, general thoughts on what grief means to them and how it has affected them personally in the past.  Mackenzie Mcgee provided information on how the process of grief/loss can differ depending upon one's unique culture, and categories of loss one could experience (i.e. loss of a person, animal, relationship, job, identity, etc).  Mackenzie Mcgee encouraged members to be mindful of how pervasive loss can be, and how to recognize signs which could indicate that this is having an impact on one's overall mental health and wellbeing.  Intervention was effective, as evidenced by Mackenzie Mcgee participating in discussion with speaker on the subject, reporting that one loss she is still dealing with is separation from her partner, who she caught cheating when she was pregnant.  Mackenzie Mcgee reported that she had not planned to be a mother, so she can also relate to the loss of freedom that came with transitioning to parental role.  Mackenzie Mcgee reported that it has helped to cope with this loss by thinking about positives which have come with parenthood, such as seeing her daughter grow into a kind, loving person.    Third Therapeutic Activity: Counselor introduced topic of creating mental health maintenance plan today.  Counselor provided handout on subject to members, which stressed the importance of maintaining one's mental health in a similar way to using diet and exercise to ensure physical health.  Counselor walked members through process of identifying triggers which could worsen symptoms, including specific people, places,  and things one needs to avoid.  Members were also tasked with identifying warning signs such as thoughts, feelings, or behaviors which could indicate  mental health is at increased risk.  Counselor also facilitated conversation on self-care activities and coping strategies which members have previously utilized in the past, are currently using in daily routine, or plan to use soon to assist with managing problems or symptoms when/if they appear.  Counselor encouraged members to revisit their maintenance plan often and make changes as needed to ensure day to day stability.  Intervention effectiveness could not be measured, as Mackenzie Mcgee had to leave group at breaktime in order to attend an appointment.  Counselor informed care team of her early departure from group today.    Assessment and Plan: Counselor recommends that Mackenzie Mcgee remain in IOP treatment to better manage mental health symptoms, ensure stability and pursue completion of treatment plan goals. Counselor recommends adherence to crisis/safety plan, taking medications as prescribed, and following up with medical professionals if any issues arise.   Follow Up Instructions: Counselor will send Webex link for next session. Mackenzie Mcgee was advised to call back or seek an in-person evaluation if the symptoms worsen or if the condition fails to improve as anticipated.   Collaboration of Care:   Medication Management AEB [name], NP                                           Case Manager AEB [name], CNA    Patient/Guardian was advised Release of Information must be obtained prior to any record release in order to collaborate their care with an outside provider. Patient/Guardian was advised if they have not already done so to contact the registration department to sign all necessary forms in order for [place] to release information regarding their care.   Consent: Patient/Guardian gives verbal consent for treatment and assignment of benefits for services provided during this visit. Patient/Guardian expressed understanding and agreed to proceed.  I provided 90 minutes of non-face-to-face time  during this encounter.   [name], LCSW, LCAS 08/12/21

## 2021-08-13 ENCOUNTER — Other Ambulatory Visit (HOSPITAL_COMMUNITY): Payer: 59 | Admitting: Licensed Clinical Social Worker

## 2021-08-13 DIAGNOSIS — F331 Major depressive disorder, recurrent, moderate: Secondary | ICD-10-CM

## 2021-08-13 DIAGNOSIS — F411 Generalized anxiety disorder: Secondary | ICD-10-CM

## 2021-08-13 MED ORDER — HYDROXYZINE HCL 25 MG PO TABS: 25.0000 mg | ORAL_TABLET | Freq: Three times a day (TID) | ORAL | 1 refills | Status: DC | PRN

## 2021-08-13 MED ORDER — BUPROPION HCL ER (XL) 150 MG PO TB24: 150.0000 mg | ORAL_TABLET | Freq: Every day | ORAL | 1 refills | Status: DC

## 2021-08-13 NOTE — Progress Notes (Signed)
Virtual Visit via Video Note   I connected with Mackenzie Mcgee. Mackenzie Mcgee on 08/13/21 at  9:00 AM EDT by a video enabled telemedicine application and verified that I am speaking with the correct person using two identifiers.   At orientation to the IOP program, Case Manager discussed the limitations of evaluation and management by telemedicine and the availability of in person appointments. The patient expressed understanding and agreed to proceed with virtual visits throughout the duration of the program.   Location:  Patient: Patient Home Provider: [facility] [place] [facility]   History of Present Illness: MDD and GAD   Observations/Objective: Check In: Case Manager checked in with all participants to review discharge dates, insurance authorizations, work-related documents and needs from the treatment team regarding medications. Mackenzie Mcgee stated needs and engaged in discussion.    Initial Therapeutic Activity: Counselor facilitated a check-in with Mackenzie Mcgee to assess for safety, sobriety and medication compliance.  Counselor also inquired about Mackenzie Mcgee's current emotional ratings, as well as any significant changes in thoughts, feelings or behavior since previous check in.  Mackenzie Mcgee presented for session on time and was alert, oriented x5, with no evidence or self-report of active SI/HI or A/V H.  Mackenzie Mcgee reported compliance with medication and denied use of alcohol or illicit substances.  Mackenzie Mcgee reported scores of 1/10 for depression, 1/10 for anxiety, and 0/10 for anger/irritability.  Mackenzie Mcgee denied any recent outbursts or panic attacks.  Mackenzie Mcgee reported that a recent success was attending a ceremony at her daughter's school.  Mackenzie Mcgee reported that a recent struggle was sleeping poorly last night due to allergies.  Mackenzie Mcgee reported that her goal today is to purchase a product from the store to keep snakes away from her property.      Second Therapeutic Activity: Counselor introduced topic of  grounding skills today.  Counselor defined these as simple strategies one can use to help detach from difficult thoughts or feelings temporarily by focusing on something else.  Counselor noted that grounding will not solve the problem at hand, but can provide the practitioner with time to regain control over their thoughts and/or feelings and prevent the situation from getting worse (i.e. interrupting a panic attack).  Counselor divided these into three categories (mental, physical, and soothing) and then provided examples of each which group members could practice during session.  Some of these included describing one's environment in detail or playing a categories game with oneself for mental category, taking a hot bath/shower, stretching, or carrying a grounding object for physical category, and saying kind statements, or visualizing people one cares about for soothing category.  Counselor inquired about which techniques members have used with success in the past, or will commit to learning, practicing, and applying now to improve coping abilities.  Intervention was effective, as evidenced by Mackenzie Mcgee participating in discussion on the subject, trying out several of the techniques during session, and expressing interest in adding several to her available coping skills, such as playing a categories game involving listing musical artists, describing an everyday activity in detail, imagining herself being at the beach in [place], reading an engrossing story/book, watching a comedy show to make her laugh, counting to 10, progressively squeezing her steering wheel, touching various objects around her, doing a body scan exercise, using deep breathing to ground herself, saying kind or copings statements to herself, thinking about her favorite color (pink), looking at pictures of her daughter, making plans to get a pedicure, and looking forward to plans with family in the following week.  Assessment and  Plan: Counselor recommends that [place] remain in IOP treatment to better manage mental health symptoms, ensure stability and pursue completion of treatment plan goals. Counselor recommends adherence to crisis/safety plan, taking medications as prescribed, and following up with medical professionals if any issues arise.   Follow Up Instructions: Counselor will send Webex link for next session. Mackenzie Mcgee was advised to call back or seek an in-person evaluation if the symptoms worsen or if the condition fails to improve as anticipated.   Collaboration of Care:   Medication Management AEB [name], NP                                           Case Manager AEB Dellia Nims, CNA    Patient/Guardian was advised Release of Information must be obtained prior to any record release in order to collaborate their care with an outside provider. Patient/Guardian was advised if they have not already done so to contact the registration department to sign all necessary forms in order for [place] to release information regarding their care.   Consent: Patient/Guardian gives verbal consent for treatment and assignment of benefits for services provided during this visit. Patient/Guardian expressed understanding and agreed to proceed.  I provided 180 minutes of non-face-to-face time during this encounter.   Shade Flood, LCSW, LCAS 08/13/21

## 2021-08-13 NOTE — Addendum Note (Signed)
Addended by: Oneta Rack on: 08/13/2021 03:27 PM   Modules accepted: Orders

## 2021-08-14 ENCOUNTER — Other Ambulatory Visit (HOSPITAL_COMMUNITY): Payer: 59 | Admitting: Licensed Clinical Social Worker

## 2021-08-14 DIAGNOSIS — F331 Major depressive disorder, recurrent, moderate: Secondary | ICD-10-CM

## 2021-08-14 DIAGNOSIS — F411 Generalized anxiety disorder: Secondary | ICD-10-CM | POA: Diagnosis not present

## 2021-08-14 NOTE — Patient Instructions (Signed)
D:  Patient will complete MH-IOP tomorrow (08-15-21).  A:  Follow up with [name] on 09-10-21 @ 3 pm.  Patient will contact the list of therapists for an appointment.  Strongly encourage support groups.  Return to work on 08-21-21.  R:  Patient receptive.

## 2021-08-14 NOTE — Progress Notes (Signed)
Virtual Visit via Video Note   I connected with Mackenzie Mcgee on 08/14/21 at  9:00 AM EDT by a video enabled telemedicine application and verified that I am speaking with the correct person using two identifiers.   At orientation to the IOP program, Case Manager discussed the limitations of evaluation and management by telemedicine and the availability of in person appointments. The patient expressed understanding and agreed to proceed with virtual visits throughout the duration of the program.   Location:  Patient: Patient Home Provider: [facility]   History of Present Illness: MDD and GAD   Observations/Objective: Check In: Case Manager checked in with all participants to review discharge dates, insurance authorizations, work-related documents and needs from the treatment team regarding medications. Mackenzie Mcgee stated needs and engaged in discussion.    Initial Therapeutic Activity: Counselor facilitated a check-in with Mackenzie Mcgee to assess for safety, sobriety and medication compliance.  Counselor also inquired about Mackenzie Mcgee's current emotional ratings, as well as any significant changes in thoughts, feelings or behavior since previous check in.  Mackenzie Mcgee presented for session on time and was alert, oriented x5, with no evidence or self-report of active SI/HI or A/V H.  Mackenzie Mcgee reported compliance with medication and denied use of alcohol or illicit substances.  Mackenzie Mcgee reported scores of 2/10 for depression, 2/10 for anxiety, and 0/10 for anger/irritability.  Mackenzie Mcgee denied any recent outbursts or panic attacks.  Mackenzie Mcgee reported that a recent success was being assertive with her daughter's father, stating "He asked me to bring our daughter to [place] over the weekend, and I said 'No' even though I normally would.  Group is working for me".  Mackenzie Mcgee denied any new struggles.  Mackenzie Mcgee reported that her goal today is to pick up her daughter from school and then go to the  [facility] afterward.     Second Therapeutic Activity: Counselor engaged the group in discussion on managing work/life balance today to improve mental health and wellness.  Counselor explained how finding balance between responsibilities at home and work place can be challenging, lead to increased stress, and this has been further complicated by recent pandemic leading to unemployment, more virtual work, and blurring of lines between home as a place of rest or work duties.  Counselor facilitated discussion on what challenges members have faced with this issue historically, as well as what, if any, issues have arisen following pandemic. Counselor also discussed strategies for improving work/life balance while members work on their mental health during treatment.  Some of these included keeping track of time management; creating a list of priorities and scaling importance; setting realistic, measurable goals each day; establishing boundaries; taking care of health needs; and nurturing relationships at home and work for support.  Counselor inquired about areas where members feel they are excelling, as well as areas they could focus on during treatment. Intervention was effective, as evidenced by Mackenzie Mcgee actively participating in discussion on topic and reporting that she has felt very 'time poor' because of her work schedule, noting that during the pandemic, several people at her job chose to retire, which led to increased workload, and unmanageable stress.  Mackenzie Mcgee reported that she experienced several symptoms of burnout, including feeling tired and drained, lowered immune system, frequent headaches, changing sleep habits, lack of motivation, self-doubt, and isolation.  Mackenzie Mcgee reported that there have also been numerous warning signs such as answering work related communications after hours, working through lunch breaks, and spending an inordinate amount of time thinking or  worrying about  work.  Mackenzie Mcgee was receptive to suggestions offered today for addressing work life imbalance, including learning to say "No" to unreasonable tasks, and setting aside more time for priorities in her life, like her daughter.  Mackenzie Mcgee reported that she needed to leave group early today in order to pick up her daughter from school.  Counselor in informed care team of her early departure.      Assessment and Plan: Counselor recommends that [place] remain in IOP treatment to better manage mental health symptoms, ensure stability and pursue completion of treatment plan goals. Counselor recommends adherence to crisis/safety plan, taking medications as prescribed, and following up with medical professionals if any issues arise.   Follow Up Instructions: Counselor will send Webex link for next session. Mackenzie Mcgee was advised to call back or seek an in-person evaluation if the symptoms worsen or if the condition fails to improve as anticipated.   Collaboration of Care:   Medication Management AEB [name], NP                                           Case Manager AEB [name], CNA    Patient/Guardian was advised Release of Information must be obtained prior to any record release in order to collaborate their care with an outside provider. Patient/Guardian was advised if they have not already done so to contact the registration department to sign all necessary forms in order for [place] to release information regarding their care.   Consent: Patient/Guardian gives verbal consent for treatment and assignment of benefits for services provided during this visit. Patient/Guardian expressed understanding and agreed to proceed.  I provided 120 minutes of non-face-to-face time during this encounter.   [name], LCSW, LCAS 08/14/21

## 2021-08-15 ENCOUNTER — Encounter (HOSPITAL_COMMUNITY): Payer: Self-pay | Admitting: Licensed Clinical Social Worker

## 2021-08-15 ENCOUNTER — Other Ambulatory Visit (HOSPITAL_COMMUNITY): Payer: 59 | Admitting: Psychiatry

## 2021-08-15 DIAGNOSIS — F411 Generalized anxiety disorder: Secondary | ICD-10-CM

## 2021-08-15 DIAGNOSIS — F331 Major depressive disorder, recurrent, moderate: Secondary | ICD-10-CM

## 2021-08-15 NOTE — Progress Notes (Signed)
Virtual Visit via Video Note   I connected with Mackenzie Mcgee on 08/15/21 at  9:00 AM EDT by a video enabled telemedicine application and verified that I am speaking with the correct person using two identifiers.   At orientation to the IOP program, Case Manager discussed the limitations of evaluation and management by telemedicine and the availability of in person appointments. The patient expressed understanding and agreed to proceed with virtual visits throughout the duration of the program.   Location:  Patient: Patient Home Provider: Counselor Home Office   History of Present Illness: MDD and GAD   Observations/Objective: Check In: Case Manager checked in with all participants to review discharge dates, insurance authorizations, work-related documents and needs from the treatment team regarding medications. Mackenzie Mcgee stated needs and engaged in discussion.    Initial Therapeutic Activity: Counselor facilitated a check-in with Mackenzie Mcgee to assess for safety, sobriety and medication compliance.  Counselor also inquired about Mackenzie Mcgee's current emotional ratings, as well as any significant changes in thoughts, feelings or behavior since previous check in.  Mackenzie Mcgee presented for session on time and was alert, oriented x5, with no evidence or self-report of active SI/HI or A/V H.  Mackenzie Mcgee reported compliance with medication and denied use of alcohol or illicit substances.  Mackenzie Mcgee reported scores of 1/10 for depression, 1/10 for anxiety, and 0/10 for anger/irritability.  Mackenzie Mcgee denied any recent outbursts or panic attacks.  Mackenzie Mcgee reported that a recent success was having the chance to sleep in today and rest.  Mackenzie Mcgee denied any new struggles.  Mackenzie Mcgee reported that her goal this weekend is to spend time with her sister and visit the [employer] together with their kids.     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  Mackenzie Mcgee provided information on history of her local agency, mission statement, and the variety of unique services offered which group members might find beneficial to engage in, including both virtual and in-person support groups, as well as peer support program for mentoring.  Mackenzie Mcgee offered time to answer member's questions regarding services and encouraged them to consider utilizing these services to assist in working towards their individual wellness goals.  Intervention was effective, as evidenced by Mackenzie Mcgee participating in discussion with speaker on the subject, and inquiring about the steps she would need to take in order to be admitted for services at [facility].  She was appreciative for feedback from [place] clarifying how she would be screened in order to get started after discharge from [facility].    Third Therapeutic Activity: Counselor introduced topic of building social support network today.  Counselor explained how this can be defined as having a having a group of healthy people in one's life you can talk to, spend time with, and get help from to improve both mental and physical health.  Counselor noted that some barriers can make it difficult to connect with other people, including the presence of anxiety or depression, or moving to an unfamiliar area.  Group members were asked to assess the current state of their support network, and identify ways that this could be improved.  Tips were given on how to address previously noted barriers, such as strengthening social skills, using relaxation techniques to reduce anxiety, scheduling social time each week, and/or exploring social events nearby which could increase chances of meeting new supports.  Members were also encouraged to consider getting closer to people they already know through suggestions  such as outreaching someone by text, email or phone call if they haven't spoken in  awhile, doing something nice for a friend/family member unexpectedly, and/or inviting someone over for a game/movie/dinner night.  Intervention was effective, as evidenced by Mackenzie Mcgee actively participating in discussion on the subject, and reporting that she has a few barriers which have affected her ability to have a robust support group, including trust issues as a result of people sharing privileged information about her in the past.  Mackenzie Mcgee stated "I've shared some of my more traumatic experiences before and that came back to bite me.  Trust is hard for me, but group is helping me learn to open up again".  Mackenzie Mcgee reported that she can still feel anxious and self-conscious at times, but she is motivated to find positive connections in her community she can open up with, including networking with other mother's at school, going to gym to meet people with similar health goals, networking with her church, and attending support groups through [place].    Assessment and Plan: Mackenzie Mcgee has completed [facility]OP today, and agreed to follow up with her therapist and psychiatrist upon discharge.  Counselor recommends adherence to crisis/safety plan, taking medications as prescribed, and following up with medical professionals if any issues arise.   Follow Up Instructions: Mackenzie Mcgee was advised to call back or seek an in-person evaluation if the symptoms worsen or if the condition fails to improve as anticipated.   Collaboration of Care:   Medication Management AEB Mackenzie Jacks, NP                                           Case Manager AEB Mackenzie [place], CNA    Patient/Guardian was advised Release of Information must be obtained prior to any record release in order to collaborate their care with an outside provider. Patient/Guardian was advised if they have not already done so to contact the registration department to sign all necessary forms in order for [place] to release information regarding their care.    Consent: Patient/Guardian gives verbal consent for treatment and assignment of benefits for services provided during this visit. Patient/Guardian expressed understanding and agreed to proceed.  I provided 180 minutes of non-face-to-face time during this encounter.   Mackenzie Stain, LCSW, LCAS 08/15/21

## 2021-08-15 NOTE — Progress Notes (Signed)
  [facility] Intensive Outpatient Program Discharge Summary  Mackenzie Mcgee [id number]  Admission date: 07/21/2021 Discharge date: 08/15/2021  Reason for admission: per admission note: "[name] is a 41 year old African-American female who is referred by attending psychiatrist Arfeen due to worsening depression and anxiety.  She reports multiple stresses with related to declining mental health states she has been experiencing irritable mood hopelessness and poor concentration.  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 States she has been assigned more responsibilities."     Progress in Program Toward Treatment Goals: Progressing patient as attended and participated with daily group session with active and engaged participation.  Denying suicidal or homicidal ideations.  Denies auditory or visual hallucinations.  Does report symptoms of worry related to restarting back in the work force.  She reports she has been taking and tolerating medications well.  Denied any other safety concerns at discharge.  Support encouragement reassurance was provided.  Progress (rationale):   Collaboration of Care: Psychiatrist AEB MD Afreen   Patient/Guardian was advised Release of Information must be obtained prior to any record release in order to collaborate their care with an outside provider. Patient/Guardian was advised if they have not already done so to contact the registration department to sign all necessary forms in order for [place] to release information regarding their care.   Consent: Patient/Guardian gives verbal consent for treatment and assignment of benefits for services provided during this visit. Patient/Guardian expressed understanding and agreed to proceed.   [name], NP 08/15/2021
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
## 2021-09-10 ENCOUNTER — Encounter (HOSPITAL_COMMUNITY): Payer: Self-pay | Admitting: Psychiatry

## 2021-09-10 ENCOUNTER — Telehealth (HOSPITAL_BASED_OUTPATIENT_CLINIC_OR_DEPARTMENT_OTHER): Payer: 59 | Admitting: Psychiatry

## 2021-09-10 VITALS — Wt 154.0 lb

## 2021-09-10 DIAGNOSIS — F900 Attention-deficit hyperactivity disorder, predominantly inattentive type: Secondary | ICD-10-CM | POA: Diagnosis not present

## 2021-09-10 DIAGNOSIS — F331 Major depressive disorder, recurrent, moderate: Secondary | ICD-10-CM

## 2021-09-10 DIAGNOSIS — F411 Generalized anxiety disorder: Secondary | ICD-10-CM

## 2021-09-10 MED ORDER — BUPROPION HCL ER (XL) 150 MG PO TB24: 150.0000 mg | ORAL_TABLET | Freq: Every day | ORAL | 2 refills | Status: DC
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
## 2021-09-17 ENCOUNTER — Encounter (HOSPITAL_COMMUNITY): Payer: Self-pay | Admitting: *Deleted

## 2021-09-17 ENCOUNTER — Telehealth (HOSPITAL_COMMUNITY): Payer: Self-pay | Admitting: *Deleted

## 2021-09-17 NOTE — Telephone Encounter (Signed)
Accomodation letter for pt claims adjuster testing sent to pt via mail. Pt notified.

## 2021-10-03 ENCOUNTER — Telehealth (INDEPENDENT_AMBULATORY_CARE_PROVIDER_SITE_OTHER): Payer: Self-pay | Admitting: Family Medicine

## 2021-10-03 ENCOUNTER — Encounter: Payer: Self-pay | Admitting: Family Medicine

## 2021-10-03 DIAGNOSIS — E1165 Type 2 diabetes mellitus with hyperglycemia: Secondary | ICD-10-CM

## 2021-10-03 DIAGNOSIS — Z794 Long term (current) use of insulin: Secondary | ICD-10-CM

## 2021-10-03 DIAGNOSIS — E663 Overweight: Secondary | ICD-10-CM

## 2021-10-03 MED ORDER — TIRZEPATIDE 7.5 MG/0.5ML ~~LOC~~ SOAJ: 7.5000 mg | SUBCUTANEOUS | 1 refills | Status: DC

## 2021-10-03 NOTE — Progress Notes (Signed)
Chief Complaint  Patient presents with   discuss changing Mounjaro dose    Subjective: Patient is a 41 y.o. female here for f/u. Due to COVID-19 pandemic, we are interacting via web portal for an electronic face-to-face visit. I verified patient's ID using 2 identifiers. Patient agreed to proceed with visit via this method. Patient is at home, I am at office. Patient and I are present for visit.   Patient has a history of diabetes and was started on Mounjaro.  She is currently taking 5 mg weekly and reports compliance without adverse effects.  She continues to walk and make healthy eating choices.  She has lost around 30 pounds but has had a plateau.  She is interested in increasing the dosage of Mounjaro to address this.  She is starting to get food cravings again which she was not having prior.  Past Medical History:  Diagnosis Date   COVID-19 virus infection 07/2020   Gestational diabetes    Morbid obesity (HCC)    Pregnancy     Objective: No conversational dyspnea Age appropriate judgment and insight Nml affect and mood  Assessment and Plan: Type 2 diabetes mellitus with hyperglycemia, with long-term current use of insulin (HCC) - Plan: tirzepatide (MOUNJARO) 7.5 MG/0.5ML Pen  Overweight (BMI 25.0-29.9) - Plan: tirzepatide (MOUNJARO) 7.5 MG/0.5ML Pen  Diabetes is probably stable.  Overweight: hit plateau for chronic issue, not controlled. Will increase Mounjaro from 5 mg/week to 7.5 mg/week. F/u  as originally scheduled in Oct. Will send message if she needs refills or wants to up-titrate further.  The patient voiced understanding and agreement to the plan.  Jilda Roche Welcome, DO 10/03/21  11:40 AM

## 2021-11-13 ENCOUNTER — Encounter: Payer: Self-pay | Admitting: Family Medicine

## 2021-11-13 ENCOUNTER — Other Ambulatory Visit: Payer: Self-pay | Admitting: Family Medicine

## 2021-11-13 MED ORDER — TIRZEPATIDE 10 MG/0.5ML ~~LOC~~ SOAJ: 10.0000 mg | SUBCUTANEOUS | 2 refills | Status: DC

## 2021-12-03 ENCOUNTER — Telehealth (HOSPITAL_BASED_OUTPATIENT_CLINIC_OR_DEPARTMENT_OTHER): Payer: 59 | Admitting: Psychiatry

## 2021-12-03 ENCOUNTER — Encounter (HOSPITAL_COMMUNITY): Payer: Self-pay | Admitting: Psychiatry

## 2021-12-03 VITALS — Wt 147.0 lb

## 2021-12-03 DIAGNOSIS — F411 Generalized anxiety disorder: Secondary | ICD-10-CM | POA: Diagnosis not present

## 2021-12-03 DIAGNOSIS — F331 Major depressive disorder, recurrent, moderate: Secondary | ICD-10-CM | POA: Diagnosis not present

## 2021-12-03 DIAGNOSIS — F900 Attention-deficit hyperactivity disorder, predominantly inattentive type: Secondary | ICD-10-CM | POA: Diagnosis not present

## 2021-12-03 MED ORDER — BUPROPION HCL ER (XL) 150 MG PO TB24: 150.0000 mg | ORAL_TABLET | Freq: Every day | ORAL | 2 refills | Status: DC
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
## 2021-12-26 ENCOUNTER — Ambulatory Visit: Payer: 59 | Admitting: Family Medicine

## 2021-12-29 ENCOUNTER — Ambulatory Visit: Payer: 59 | Admitting: Family Medicine

## 2021-12-29 ENCOUNTER — Encounter: Payer: Self-pay | Admitting: Family Medicine

## 2021-12-29 VITALS — BP 110/64 | HR 78 | Temp 98.3°F | Ht 65.0 in | Wt 145.0 lb

## 2021-12-29 DIAGNOSIS — Z794 Long term (current) use of insulin: Secondary | ICD-10-CM

## 2021-12-29 DIAGNOSIS — E1165 Type 2 diabetes mellitus with hyperglycemia: Secondary | ICD-10-CM | POA: Diagnosis not present

## 2021-12-29 DIAGNOSIS — Z23 Encounter for immunization: Secondary | ICD-10-CM

## 2021-12-29 LAB — COMPREHENSIVE METABOLIC PANEL
ALT: 7 U/L (ref 0–35)
AST: 9 U/L (ref 0–37)
Albumin: 4.3 g/dL (ref 3.5–5.2)
Alkaline Phosphatase: 34 U/L — ABNORMAL LOW (ref 39–117)
BUN: 10 mg/dL (ref 6–23)
CO2: 23 mEq/L (ref 19–32)
Calcium: 9.4 mg/dL (ref 8.4–10.5)
Chloride: 103 mEq/L (ref 96–112)
Creatinine, Ser: 0.65 mg/dL (ref 0.40–1.20)
GFR: 110.78 mL/min (ref >60.00)
Glucose, Bld: 89 mg/dL (ref 70–99)
Potassium: 4 mEq/L (ref 3.5–5.1)
Sodium: 137 mEq/L (ref 135–145)
Total Bilirubin: 0.6 mg/dL (ref 0.2–1.2)
Total Protein: 7.2 g/dL (ref 6.0–8.3)

## 2021-12-29 LAB — MICROALBUMIN / CREATININE URINE RATIO
Creatinine,U: 229.4 mg/dL
Microalb Creat Ratio: 0.6 mg/g (ref 0.0–30.0)
Microalb, Ur: 1.3 mg/dL (ref 0.0–1.9)

## 2021-12-29 LAB — LIPID PANEL
Cholesterol: 179 mg/dL (ref 0–200)
HDL: 63.1 mg/dL (ref >39.00)
LDL Cholesterol: 105 mg/dL — ABNORMAL HIGH (ref 0–99)
NonHDL: 115.99
Total CHOL/HDL Ratio: 3
Triglycerides: 53 mg/dL (ref 0.0–149.0)
VLDL: 10.6 mg/dL (ref 0.0–40.0)

## 2021-12-29 LAB — HEMOGLOBIN A1C: Hgb A1c MFr Bld: 5.7 % (ref 4.6–6.5)

## 2021-12-29 MED ORDER — LEVOCETIRIZINE DIHYDROCHLORIDE 5 MG PO TABS: 5.0000 mg | ORAL_TABLET | Freq: Every evening | ORAL | 2 refills | Status: DC

## 2021-12-29 MED ORDER — FLUTICASONE PROPIONATE 50 MCG/ACT NA SUSP: 2.0000 | Freq: Every day | NASAL | 6 refills | Status: AC

## 2021-12-29 NOTE — Progress Notes (Signed)
Subjective:   Chief Complaint  Patient presents with   Follow-up   Cough    Mackenzie Mcgee is a 41 y.o. female here for follow-up of diabetes.   Mackenzie Mcgee does not routinely monitor her sugars.  Patient does not require insulin.   Medications include: Mounjaro 10 mg/week Diet is healthy.  Exercise: walking  Past Medical History:  Diagnosis Date   COVID-19 virus infection 07/2020   Gestational diabetes    Morbid obesity ([place])    Pregnancy      Related testing: Retinal exam: Due Pneumovax: done  Objective:  BP 110/64 (BP Location: Left Arm, Patient Position: Sitting, Cuff Size: Normal)   Pulse 78   Temp 98.3 F (36.8 C) (Oral)   Ht 5\' 5"  (1.651 m)   Wt 145 lb (65.8 kg)   SpO2 99%   BMI 24.13 kg/m  General:  Well developed, well nourished, in no apparent distress Skin:  Warm, no pallor or diaphoresis Head:  Normocephalic, atraumatic Eyes:  Pupils equal and round, sclera anicteric without injection  Lungs:  CTAB, no access msc use Cardio:  RRR, no bruits, no LE edema Neuro:  Sensation intact to pinprick on feet Psych: Age appropriate judgment and insight  Assessment:   Type 2 diabetes mellitus with hyperglycemia, with long-term current use of insulin (HCC) - Plan: Comprehensive metabolic panel, Lipid panel, Hemoglobin A1c, Microalbumin / creatinine urine ratio   Plan:   Chronic, stable. Counseled on diet and exercise. Cont Monjaro 10 mg/week.  Flu shot today.  F/u in 6 mo. The patient voiced understanding and agreement to the plan.  [place], DO 12/29/21 9:08 AM

## 2021-12-29 NOTE — Addendum Note (Signed)
Addended by: Sharon Seller B on: 12/29/2021 09:22 AM   Modules accepted: Orders

## 2021-12-29 NOTE — Patient Instructions (Addendum)
Give [place] 2-3 business days to get the results of your labs back.   Keep the diet clean and stay active. Add weight resistance exercise to your regimen.   Please schedule your eye exam.   Call Center for [facility] at [phone] for an appointment.  They are located at [address], [place], [place], [address] (right across the hall from our office).  Claritin (loratadine), Allegra (fexofenadine), Zyrtec (cetirizine) which is also equivalent to Xyzal (levocetirizine); these are listed in order from weakest to strongest. Generic, and therefore cheaper, options are in the parentheses.   Flonase (fluticasone); nasal spray that is over the counter. 2 sprays each nostril, once daily. Aim towards the same side eye when you spray.  There are available OTC, and the generic versions, which may be cheaper, are in parentheses. Show this to a pharmacist if you have trouble finding any of these items.  Let us know if you need anything.

## 2022-01-25 ENCOUNTER — Other Ambulatory Visit: Payer: Self-pay | Admitting: Family Medicine

## 2022-02-03 ENCOUNTER — Telehealth: Payer: Self-pay

## 2022-02-03 MED ORDER — MOUNJARO 12.5 MG/0.5ML ~~LOC~~ SOAJ: 12.5000 mg | SUBCUTANEOUS | 3 refills | Status: DC

## 2022-02-03 NOTE — Telephone Encounter (Signed)
Patient informed Sent in the [facility] 12.5

## 2022-02-03 NOTE — Telephone Encounter (Signed)
PA initiated via Covermymeds; KEY: [id number]. Awaiting determination.   Plan requires documentation of type 2 diabetes diagnosis. I have asked that they call us with their fax number.

## 2022-02-03 NOTE — Telephone Encounter (Signed)
PA approved.   Request Reference Number: [id number]. MOUNJARO INJ 5MG /0.5 is approved through 02/04/2023. Your patient may now fill this prescription and it will be covered.

## 2022-02-03 NOTE — Telephone Encounter (Signed)
Called informed of approval. She would like to know what she goes up a dose.

## 2022-02-03 NOTE — Addendum Note (Signed)
Addended by: Scharlene Gloss B on: 02/03/2022 03:37 PM   Modules accepted: Orders

## 2022-02-22 ENCOUNTER — Other Ambulatory Visit: Payer: Self-pay | Admitting: Family Medicine

## 2022-03-04 ENCOUNTER — Telehealth (HOSPITAL_COMMUNITY): Payer: 59 | Admitting: Psychiatry

## 2022-04-29 ENCOUNTER — Encounter: Payer: Self-pay | Admitting: Family Medicine

## 2022-04-29 ENCOUNTER — Other Ambulatory Visit: Payer: Self-pay | Admitting: Family Medicine

## 2022-04-29 DIAGNOSIS — E1165 Type 2 diabetes mellitus with hyperglycemia: Secondary | ICD-10-CM

## 2022-04-29 DIAGNOSIS — H18609 Keratoconus, unspecified, unspecified eye: Secondary | ICD-10-CM

## 2022-04-29 MED ORDER — TIRZEPATIDE 15 MG/0.5ML ~~LOC~~ SOAJ
15.0000 mg | SUBCUTANEOUS | 2 refills | Status: DC
  Filled 2022-05-11 – 2022-05-22 (×2): qty 2, 28d supply, fill #0
  Filled 2022-06-14: qty 2, 28d supply, fill #1

## 2022-05-11 ENCOUNTER — Other Ambulatory Visit (HOSPITAL_BASED_OUTPATIENT_CLINIC_OR_DEPARTMENT_OTHER): Payer: Self-pay

## 2022-05-22 ENCOUNTER — Other Ambulatory Visit (HOSPITAL_BASED_OUTPATIENT_CLINIC_OR_DEPARTMENT_OTHER): Payer: Self-pay

## 2022-06-18 ENCOUNTER — Other Ambulatory Visit (HOSPITAL_COMMUNITY): Payer: Self-pay

## 2022-06-24 ENCOUNTER — Other Ambulatory Visit (HOSPITAL_COMMUNITY): Payer: Self-pay

## 2022-06-30 ENCOUNTER — Ambulatory Visit (INDEPENDENT_AMBULATORY_CARE_PROVIDER_SITE_OTHER): Payer: 59 | Admitting: Family Medicine

## 2022-06-30 ENCOUNTER — Encounter: Payer: Self-pay | Admitting: Family Medicine

## 2022-06-30 ENCOUNTER — Other Ambulatory Visit (HOSPITAL_BASED_OUTPATIENT_CLINIC_OR_DEPARTMENT_OTHER): Payer: Self-pay

## 2022-06-30 VITALS — BP 110/60 | HR 79 | Temp 97.6°F | Resp 18 | Ht 64.5 in | Wt 136.8 lb

## 2022-06-30 DIAGNOSIS — Z0000 Encounter for general adult medical examination without abnormal findings: Secondary | ICD-10-CM | POA: Diagnosis not present

## 2022-06-30 DIAGNOSIS — E1165 Type 2 diabetes mellitus with hyperglycemia: Secondary | ICD-10-CM | POA: Diagnosis not present

## 2022-06-30 DIAGNOSIS — Z794 Long term (current) use of insulin: Secondary | ICD-10-CM

## 2022-06-30 DIAGNOSIS — Z1231 Encounter for screening mammogram for malignant neoplasm of breast: Secondary | ICD-10-CM | POA: Diagnosis not present

## 2022-06-30 LAB — COMPREHENSIVE METABOLIC PANEL
ALT: 8 U/L (ref 0–35)
AST: 10 U/L (ref 0–37)
Albumin: 4.3 g/dL (ref 3.5–5.2)
Alkaline Phosphatase: 28 U/L — ABNORMAL LOW (ref 39–117)
BUN: 11 mg/dL (ref 6–23)
CO2: 24 mEq/L (ref 19–32)
Calcium: 8.9 mg/dL (ref 8.4–10.5)
Chloride: 106 mEq/L (ref 96–112)
Creatinine, Ser: 0.57 mg/dL (ref 0.40–1.20)
GFR: 113.94 mL/min (ref >60.00)
Glucose, Bld: 87 mg/dL (ref 70–99)
Potassium: 4.1 mEq/L (ref 3.5–5.1)
Sodium: 139 mEq/L (ref 135–145)
Total Bilirubin: 0.5 mg/dL (ref 0.2–1.2)
Total Protein: 6.7 g/dL (ref 6.0–8.3)

## 2022-06-30 LAB — LIPID PANEL
Cholesterol: 155 mg/dL (ref 0–200)
HDL: 62.9 mg/dL (ref >39.00)
LDL Cholesterol: 76 mg/dL (ref 0–99)
NonHDL: 92.21
Total CHOL/HDL Ratio: 2
Triglycerides: 81 mg/dL (ref 0.0–149.0)
VLDL: 16.2 mg/dL (ref 0.0–40.0)

## 2022-06-30 LAB — CBC
HCT: 39.6 % (ref 36.0–46.0)
Hemoglobin: 13.3 g/dL (ref 12.0–15.0)
MCHC: 33.7 g/dL (ref 30.0–36.0)
MCV: 84.7 fl (ref 78.0–100.0)
Platelets: 333 10*3/uL (ref 150.0–400.0)
RBC: 4.67 Mil/uL (ref 3.87–5.11)
RDW: 13.5 % (ref 11.5–15.5)
WBC: 6.8 10*3/uL (ref 4.0–10.5)

## 2022-06-30 LAB — HEMOGLOBIN A1C: Hgb A1c MFr Bld: 5.5 % (ref 4.6–6.5)

## 2022-06-30 MED ORDER — TIRZEPATIDE 15 MG/0.5ML ~~LOC~~ SOAJ
15.0000 mg | SUBCUTANEOUS | 2 refills | Status: DC
  Filled 2022-06-30: qty 2, 28d supply, fill #0

## 2022-06-30 MED ORDER — ROSUVASTATIN CALCIUM 5 MG PO TABS
5.0000 mg | ORAL_TABLET | Freq: Every day | ORAL | 3 refills | Status: DC
Start: 2022-06-30 — End: 2023-01-27
  Filled 2022-06-30 – 2022-07-13 (×2): qty 30, 30d supply, fill #0
  Filled 2022-09-15: qty 30, 30d supply, fill #1
  Filled 2022-10-11 – 2023-01-24 (×2): qty 30, 30d supply, fill #2

## 2022-06-30 NOTE — Progress Notes (Signed)
Chief Complaint  Patient presents with   Annual Exam    Concerns/ questions: pt says she is waiting on availibility for her mounjaro- otherwise she is fine.     Well Woman Mackenzie Mcgee is here for a complete physical.   Her last physical was >1 year ago.  Current diet: in general, a "healthy" diet. Current exercise: walking, some wt resistance. Weight is stable and she denies fatigue out of ordinary. Seatbelt? Yes Advanced directive? No  Health Maintenance Pap/HPV- Yes Mammogram- scheduled Tetanus- Yes Hep C screening- Yes HIV screening- Yes  Past Medical History:  Diagnosis Date   COVID-19 virus infection 07/2020   Gestational diabetes    Morbid obesity    Pregnancy      Past Surgical History:  Procedure Laterality Date   HERNIA REPAIR     WISDOM TOOTH EXTRACTION  2014    Medications  Current Outpatient Medications on File Prior to Visit  Medication Sig Dispense Refill   aspirin EC 81 MG EC tablet Take 1 tablet (81 mg total) by mouth daily. Swallow whole. 30 tablet 11   blood glucose meter kit and supplies KIT Dispense based on patient and insurance preference. Use up to four times daily as directed. 1 each 0   buPROPion (WELLBUTRIN XL) 150 MG 24 hr tablet Take 1 tablet (150 mg total) by mouth daily. 30 tablet 2   fluticasone (FLONASE) 50 MCG/ACT nasal spray Place 2 sprays into both nostrils daily. 16 g 6   hydrOXYzine (ATARAX) 25 MG tablet Take 1-3 tablets (25-75 mg total) by mouth every 8 (eight) hours as needed for anxiety. 60 tablet 1   Lancets (ONETOUCH DELICA PLUS LANCET33G) MISC USE AS DIRECTED UP TO FOUR  TIMES  DAILY 100 each 6   levocetirizine (XYZAL) 5 MG tablet Take 1 tablet (5 mg total) by mouth every evening. 30 tablet 2   ONETOUCH VERIO test strip USE AS DIRECTED UP TO FOUR  TIMES  DAILY 100 each 0   pantoprazole (PROTONIX) 40 MG tablet Take 1 tablet (40 mg total) by mouth daily. 30 tablet 1    Allergies No Known Allergies  Review of  Systems: Constitutional:  no unexpected weight changes Eye:  no recent significant change in vision Ear/Nose/Mouth/Throat:  Ears:  no recent change in hearing Nose/Mouth/Throat:  no complaints of nasal congestion, no sore throat Cardiovascular: no chest pain Respiratory:  no shortness of breath Gastrointestinal:  no abdominal pain, no change in bowel habits GU:  Female: negative for dysuria or pelvic pain Musculoskeletal/Extremities:  no pain of the joints Integumentary (Skin/Breast):  no abnormal skin lesions reported Neurologic:  no headaches Endocrine:  denies fatigue Hematologic/Lymphatic:  No areas of easy bleeding  Exam BP 110/60 (BP Location: Left Arm, Patient Position: Sitting, Cuff Size: Normal)   Pulse 79   Temp 97.6 F (36.4 C) (Oral)   Resp 18   Ht 5' 4.5" (1.638 m)   Wt 136 lb 12.8 oz (62.1 kg)   SpO2 99%   BMI 23.12 kg/m  General:  well developed, well nourished, in no apparent distress Skin:  no significant moles, warts, or growths Head:  no masses, lesions, or tenderness Eyes:  pupils equal and round, sclera anicteric without injection Ears:  canals without lesions, TMs shiny without retraction, no obvious effusion, no erythema Nose:  nares patent, mucosa normal, and no drainage Throat/Pharynx:  lips and gingiva without lesion; tongue and uvula midline; non-inflamed pharynx; no exudates or postnasal drainage Neck: neck supple  without adenopathy, thyromegaly, or masses Lungs:  clear to auscultation, breath sounds equal bilaterally, no respiratory distress Cardio:  regular rate and rhythm, no LE edema Abdomen:  abdomen soft, nontender; bowel sounds normal; no masses or organomegaly Genital: Defer to GYN Musculoskeletal:  symmetrical muscle groups noted without atrophy or deformity Extremities:  no clubbing, cyanosis, or edema, no deformities, no skin discoloration Neuro:  gait normal; deep tendon reflexes normal and symmetric Psych: well oriented with normal  range of affect and appropriate judgment/insight  Assessment and Plan  Well adult exam - Plan: CBC, Comprehensive metabolic panel, Lipid panel  Type 2 diabetes mellitus with hyperglycemia, with long-term current use of insulin - Plan: tirzepatide (MOUNJARO) 15 MG/0.5ML Pen, Hemoglobin A1c, rosuvastatin (CRESTOR) 5 MG tablet  Encounter for screening mammogram for malignant neoplasm of breast - Plan: MM DIGITAL SCREENING BILATERAL   Well 42 y.o. female. Counseled on diet and exercise. Has no plans of getting pregnant anymore.  Mammogram ordered. [facility] info provided. She has an eye exam scheduled for Aug.  Start statin, cont Mounjaro. She has done an excellent job losing weight.  Advanced directive form provided today.  Other orders as above. Follow up in 6 mo or prn. The patient voiced understanding and agreement to the plan.  Jilda Roche [place], DO 06/30/22 9:03 AM

## 2022-06-30 NOTE — Patient Instructions (Addendum)
Give [place] 2-3 business days to get the results of your labs back.   Call Center for [facility] at [facility] at [phone] for an appointment.  They are located at [address], [address], [place], [place], [address] (right across the hall from our office).  Keep the diet clean and stay active.  Please get me a copy of your advanced directive form at your convenience.   Let us know if you need anything.

## 2022-07-05 ENCOUNTER — Other Ambulatory Visit (HOSPITAL_COMMUNITY): Payer: Self-pay | Admitting: Family

## 2022-07-05 DIAGNOSIS — F411 Generalized anxiety disorder: Secondary | ICD-10-CM

## 2022-07-06 ENCOUNTER — Other Ambulatory Visit (HOSPITAL_BASED_OUTPATIENT_CLINIC_OR_DEPARTMENT_OTHER): Payer: Self-pay

## 2022-07-07 ENCOUNTER — Other Ambulatory Visit (HOSPITAL_BASED_OUTPATIENT_CLINIC_OR_DEPARTMENT_OTHER): Payer: Self-pay

## 2022-07-10 ENCOUNTER — Encounter: Payer: Self-pay | Admitting: Family Medicine

## 2022-07-13 ENCOUNTER — Encounter (HOSPITAL_BASED_OUTPATIENT_CLINIC_OR_DEPARTMENT_OTHER): Payer: Self-pay

## 2022-07-13 ENCOUNTER — Ambulatory Visit (HOSPITAL_BASED_OUTPATIENT_CLINIC_OR_DEPARTMENT_OTHER)
Admission: RE | Admit: 2022-07-13 | Discharge: 2022-07-13 | Disposition: A | Discharge status: Home / Self Care | Payer: 59 | Source: Ambulatory Visit | Attending: Family Medicine | Admitting: Family Medicine

## 2022-07-13 ENCOUNTER — Other Ambulatory Visit (HOSPITAL_BASED_OUTPATIENT_CLINIC_OR_DEPARTMENT_OTHER): Payer: Self-pay

## 2022-07-13 DIAGNOSIS — Z1231 Encounter for screening mammogram for malignant neoplasm of breast: Secondary | ICD-10-CM | POA: Insufficient documentation

## 2022-07-21 ENCOUNTER — Other Ambulatory Visit: Payer: Self-pay | Admitting: Family Medicine

## 2022-07-22 ENCOUNTER — Other Ambulatory Visit: Payer: Self-pay | Admitting: Family Medicine

## 2022-08-03 ENCOUNTER — Other Ambulatory Visit (HOSPITAL_COMMUNITY): Payer: Self-pay | Admitting: Psychiatry

## 2022-08-03 DIAGNOSIS — F331 Major depressive disorder, recurrent, moderate: Secondary | ICD-10-CM

## 2022-08-03 DIAGNOSIS — F411 Generalized anxiety disorder: Secondary | ICD-10-CM

## 2022-08-05 ENCOUNTER — Encounter: Payer: Self-pay | Admitting: Family Medicine

## 2022-08-05 DIAGNOSIS — F411 Generalized anxiety disorder: Secondary | ICD-10-CM

## 2022-08-05 DIAGNOSIS — F331 Major depressive disorder, recurrent, moderate: Secondary | ICD-10-CM

## 2022-08-06 NOTE — Telephone Encounter (Signed)
Okay to refill , prescribed by previous provider?

## 2022-08-07 MED ORDER — BUPROPION HCL ER (XL) 150 MG PO TB24
150.0000 mg | ORAL_TABLET | Freq: Every day | ORAL | 2 refills | Status: DC
Start: 2022-08-07 — End: 2022-11-13

## 2022-08-10 ENCOUNTER — Encounter: Payer: Self-pay | Admitting: Family Medicine

## 2022-10-20 ENCOUNTER — Encounter: Payer: Self-pay | Admitting: Family

## 2022-10-20 ENCOUNTER — Encounter: Payer: Self-pay | Admitting: Family Medicine

## 2022-10-20 ENCOUNTER — Telehealth (INDEPENDENT_AMBULATORY_CARE_PROVIDER_SITE_OTHER): Payer: 59 | Admitting: Family

## 2022-10-20 ENCOUNTER — Encounter: Payer: 59 | Admitting: Medical

## 2022-10-20 ENCOUNTER — Other Ambulatory Visit: Payer: Self-pay | Admitting: Family

## 2022-10-20 VITALS — Ht 64.5 in

## 2022-10-20 DIAGNOSIS — U071 COVID-19: Secondary | ICD-10-CM

## 2022-10-20 MED ORDER — NIRMATRELVIR/RITONAVIR (PAXLOVID)TABLET: 3.0000 | ORAL_TABLET | Freq: Two times a day (BID) | ORAL | 0 refills | Status: AC

## 2022-10-20 NOTE — Progress Notes (Signed)
Mackenzie Mcgee is a 42 y.o. female with the following history as recorded in Epic[facility]:  Patient Active Problem List   Diagnosis Date Noted   GAD (generalized anxiety disorder) 05/19/2021   Depression, recurrent (HCC) 05/19/2021   Type 2 diabetes mellitus with hyperglycemia, with long-term current use of insulin (HCC) 09/25/2020   Ketoacidosis 09/17/2020   Hypokalemia 09/17/2020   Obesity (BMI 30-39.9) 09/17/2020   Ptosis of left eyelid 09/17/2020   Dissection of left carotid artery (HCC) 09/17/2020   DKA (diabetic ketoacidosis) (HCC) 09/16/2020   Pregnancy    COVID-19 virus infection 07/2020    Current Outpatient Medications  Medication Sig Dispense Refill   aspirin EC 81 MG EC tablet Take 1 tablet (81 mg total) by mouth daily. Swallow whole. 30 tablet 11   blood glucose meter kit and supplies KIT Dispense based on patient and insurance preference. Use up to four times daily as directed. 1 each 0   buPROPion (WELLBUTRIN XL) 150 MG 24 hr tablet Take 1 tablet (150 mg total) by mouth daily. 30 tablet 2   fluticasone (FLONASE) 50 MCG/ACT nasal spray Place 2 sprays into both nostrils daily. 16 g 6   hydrOXYzine (ATARAX) 25 MG tablet Take 1-3 tablets (25-75 mg total) by mouth every 8 (eight) hours as needed for anxiety. 60 tablet 1   Lancets (ONETOUCH DELICA PLUS LANCET33G) MISC USE AS DIRECTED UP TO FOUR  TIMES  DAILY 100 each 6   levocetirizine (XYZAL) 5 MG tablet Take 1 tablet (5 mg total) by mouth every evening. 30 tablet 2   nirmatrelvir/ritonavir (PAXLOVID) 20 x 150 MG & 10 x 100MG  TABS Take 3 tablets by mouth 2 (two) times daily for 5 days. (Take nirmatrelvir 150 mg two tablets twice daily for 5 days and ritonavir 100 mg one tablet twice daily for 5 days) Patient GFR is 113 30 tablet 0   ONETOUCH VERIO test strip USE AS DIRECTED UP TO FOUR  TIMES  DAILY 100 each 0   pantoprazole (PROTONIX) 40 MG tablet Take 1 tablet (40 mg total) by mouth daily. 30 tablet 1   rosuvastatin  (CRESTOR) 5 MG tablet Take 1 tablet (5 mg total) by mouth daily. 90 tablet 3   tirzepatide (MOUNJARO) 12.5 MG/0.5ML Pen INJECT 1 SYRINGE ONCE A WEEK 6 mL 2   No current facility-administered medications for this visit.    Allergies: Patient has no known allergies.  Past Medical History:  Diagnosis Date   COVID-19 virus infection 07/2020   Gestational diabetes    Morbid obesity (HCC)    Pregnancy     Past Surgical History:  Procedure Laterality Date   HERNIA REPAIR     WISDOM TOOTH EXTRACTION  2014    Family History  Problem Relation Age of Onset   Healthy Mother    Depression Mother    Alcohol abuse Father    Depression Father    Drug abuse Father     Social History   Tobacco Use   Smoking status: Former    Current packs/day: 0.50    Average packs/day: 0.5 packs/day for 4.0 years (2.0 ttl pk-yrs)    Types: Cigarettes   Smokeless tobacco: Never  Substance Use Topics   Alcohol use: Not Currently    Subjective:    I connected with Mackenzie Mcgee on 10/20/22 at  2:20 PM EDT by a video enabled telemedicine application and verified that I am speaking with the correct person using two identifiers.   I discussed  the limitations of evaluation and management by telemedicine and the availability of in person appointments. The patient expressed understanding and agreed to proceed. Provider in office/ patient is at home; provider and patient are only 2 people on video call.   Has been feeling sick x 3 days; tested positive for COVID this morning; requesting Rx for Paxlovid; + fatigue, body aches, fever; no chest pain or shortness of breath; has been vomiting but able to keep Gatorade down; notes that mother also sick with COVID;   LMP 2 weeks ago   Objective:  Vitals:   10/20/22 1419  Height: 5' 4.5" (1.638 m)    General: Well developed, well nourished, in no acute distress  Skin : Warm and dry.  Head: Normocephalic and atraumatic  Lungs: Respirations unlabored;   Neurologic: Alert and oriented; speech intact; face symmetrical; moves all extremities well; CNII-XII intact without focal deficit   Assessment:  1. COVID-19 virus infection     Plan:  Rx for Paxlovid- take as directed; symptomatic treatment discussed; continue to work on staying hydrated; needs to hold Crestor while on Paxlovid; follow up worse, no better;   No follow-ups on file.  No orders of the defined types were placed in this encounter.   Requested Prescriptions   Signed Prescriptions Disp Refills   nirmatrelvir/ritonavir (PAXLOVID) 20 x 150 MG & 10 x 100MG  TABS 30 tablet 0    Sig: Take 3 tablets by mouth 2 (two) times daily for 5 days. (Take nirmatrelvir 150 mg two tablets twice daily for 5 days and ritonavir 100 mg one tablet twice daily for 5 days) Patient GFR is 113

## 2022-10-20 NOTE — Telephone Encounter (Signed)
Pt scheduled with Vernona Rieger today.

## 2022-10-27 ENCOUNTER — Other Ambulatory Visit (HOSPITAL_BASED_OUTPATIENT_CLINIC_OR_DEPARTMENT_OTHER): Payer: Self-pay

## 2022-11-11 LAB — HM DIABETES EYE EXAM

## 2022-11-13 ENCOUNTER — Telehealth (INDEPENDENT_AMBULATORY_CARE_PROVIDER_SITE_OTHER): Payer: 59 | Admitting: Family Medicine

## 2022-11-13 ENCOUNTER — Encounter: Payer: Self-pay | Admitting: Family Medicine

## 2022-11-13 ENCOUNTER — Telehealth (HOSPITAL_COMMUNITY): Payer: Self-pay | Admitting: Psychiatry

## 2022-11-13 DIAGNOSIS — F331 Major depressive disorder, recurrent, moderate: Secondary | ICD-10-CM

## 2022-11-13 DIAGNOSIS — F411 Generalized anxiety disorder: Secondary | ICD-10-CM

## 2022-11-13 MED ORDER — BUPROPION HCL ER (XL) 300 MG PO TB24
300.0000 mg | ORAL_TABLET | Freq: Every day | ORAL | 3 refills | Status: DC
Start: 2022-11-13 — End: 2023-01-27
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
## 2022-11-16 ENCOUNTER — Telehealth (HOSPITAL_COMMUNITY): Payer: Self-pay | Admitting: Psychiatry

## 2022-11-18 ENCOUNTER — Other Ambulatory Visit (HOSPITAL_COMMUNITY): Payer: 59 | Admitting: Psychiatry

## 2022-11-18 ENCOUNTER — Telehealth (HOSPITAL_COMMUNITY): Payer: Self-pay | Admitting: Psychiatry

## 2022-11-19 ENCOUNTER — Other Ambulatory Visit (HOSPITAL_COMMUNITY): Payer: 59 | Attending: Psychiatry | Admitting: Psychiatry
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
## 2022-11-20 ENCOUNTER — Encounter: Payer: Self-pay | Admitting: Obstetrics & Gynecology

## 2022-11-20 ENCOUNTER — Other Ambulatory Visit (HOSPITAL_COMMUNITY)
Admission: RE | Admit: 2022-11-20 | Discharge: 2022-11-20 | Disposition: A | Discharge status: Home / Self Care | Payer: 59 | Source: Ambulatory Visit | Attending: Medical | Admitting: Medical

## 2022-11-20 ENCOUNTER — Ambulatory Visit: Payer: 59 | Admitting: Obstetrics & Gynecology

## 2022-11-20 VITALS — BP 98/39 | HR 62 | Ht 64.5 in | Wt 129.0 lb

## 2022-11-20 DIAGNOSIS — Z113 Encounter for screening for infections with a predominantly sexual mode of transmission: Secondary | ICD-10-CM

## 2022-11-20 DIAGNOSIS — Z01419 Encounter for gynecological examination (general) (routine) without abnormal findings: Secondary | ICD-10-CM

## 2022-11-20 NOTE — Progress Notes (Signed)
GYNECOLOGY ANNUAL PREVENTATIVE CARE ENCOUNTER NOTE  History:     Mackenzie Mcgee is a 42 y.o. G2P0 female here for a routine annual gynecologic exam.  Current complaints: none.  Not currently sexually active but desires annual STI screen.  Denies abnormal vaginal bleeding, discharge, pelvic pain,  or other gynecologic concerns.    Gynecologic History Patient's last menstrual period was 10/16/2022 (approximate). Contraception: abstinence Last Pap: 2021. Result was normal with negative HPV Last Mammogram: 07/13/2022.  Result was normal  Obstetric History OB History  Gravida Para Term Preterm AB Living  2            SAB IAB Ectopic Multiple Live Births               # Outcome Date GA Lbr Len/2nd Weight Sex Type Anes PTL Lv  2 Gravida           1 Gravida             Past Medical History:  Diagnosis Date   COVID-19 virus infection 07/2020   Depression, recurrent (HCC) 05/19/2021   Dissection of left carotid artery (HCC) 09/17/2020   DKA (diabetic ketoacidosis) (HCC) 09/16/2020   GAD (generalized anxiety disorder) 05/19/2021   Gestational diabetes    Morbid obesity (HCC)    Ptosis of left eyelid 09/17/2020   Type 2 diabetes mellitus with hyperglycemia, with long-term current use of insulin (HCC) 09/25/2020    Past Surgical History:  Procedure Laterality Date   HERNIA REPAIR     WISDOM TOOTH EXTRACTION  2014    Current Outpatient Medications on File Prior to Visit  Medication Sig Dispense Refill   buPROPion (WELLBUTRIN XL) 300 MG 24 hr tablet Take 1 tablet (300 mg total) by mouth daily. 30 tablet 3   fluticasone (FLONASE) 50 MCG/ACT nasal spray Place 2 sprays into both nostrils daily. 16 g 6   hydrOXYzine (ATARAX) 25 MG tablet Take 1-3 tablets (25-75 mg total) by mouth every 8 (eight) hours as needed for anxiety. 60 tablet 1   levocetirizine (XYZAL) 5 MG tablet Take 1 tablet (5 mg total) by mouth every evening. 30 tablet 2   pantoprazole (PROTONIX) 40 MG tablet  Take 1 tablet (40 mg total) by mouth daily. 30 tablet 1   rosuvastatin (CRESTOR) 5 MG tablet Take 1 tablet (5 mg total) by mouth daily. 90 tablet 3   tirzepatide (MOUNJARO) 12.5 MG/0.5ML Pen INJECT 1 SYRINGE ONCE A WEEK 6 mL 2   aspirin EC 81 MG EC tablet Take 1 tablet (81 mg total) by mouth daily. Swallow whole. (Patient not taking: Reported on 11/20/2022) 30 tablet 11   blood glucose meter kit and supplies KIT Dispense based on patient and insurance preference. Use up to four times daily as directed. 1 each 0   Lancets (ONETOUCH DELICA PLUS LANCET33G) MISC USE AS DIRECTED UP TO FOUR  TIMES  DAILY 100 each 6   ONETOUCH VERIO test strip USE AS DIRECTED UP TO FOUR  TIMES  DAILY 100 each 0   No current facility-administered medications on file prior to visit.    No Known Allergies  Social History:  reports that she has quit smoking. Her smoking use included cigarettes. She has a 2 pack-year smoking history. She has never used smokeless tobacco. She reports that she does not currently use alcohol. She reports that she does not use drugs.  Family History  Problem Relation Age of Onset   Healthy Mother    Depression  Mother    Alcohol abuse Father    Depression Father    Drug abuse Father     The following portions of the patient's history were reviewed and updated as appropriate: allergies, current medications, past family history, past medical history, past social history, past surgical history and problem list.  Review of Systems Pertinent items noted in HPI and remainder of comprehensive ROS otherwise negative.  Physical Exam:  BP (!) 98/39   Pulse 62   Ht 5' 4.5" (1.638 m)   Wt 129 lb (58.5 kg)   LMP 10/16/2022 (Approximate)   Breastfeeding No   BMI 21.80 kg/m  CONSTITUTIONAL: Well-developed, well-nourished female in no acute distress.  HENT:  Normocephalic, atraumatic, External right and left ear normal.  EYES: Conjunctivae and EOM are normal. Pupils are equal, round, and  reactive to light. No scleral icterus.  NECK: Normal range of motion, supple, no masses.  Normal thyroid.  SKIN: Skin is warm and dry. No rash noted. Not diaphoretic. No erythema. No pallor. MUSCULOSKELETAL: Normal range of motion. No tenderness.  No cyanosis, clubbing, or edema. NEUROLOGIC: Alert and oriented to person, place, and time. Normal reflexes, muscle tone coordination.  PSYCHIATRIC: Normal mood and affect. Normal behavior. Normal judgment and thought content. CARDIOVASCULAR: Normal heart rate noted, regular rhythm RESPIRATORY: Clear to auscultation bilaterally. Effort and breath sounds normal, no problems with respiration noted. BREASTS: Symmetric in size. No masses, tenderness, skin changes, nipple drainage, or lymphadenopathy bilaterally. Performed in the presence of a chaperone. ABDOMEN: Soft, no distention noted.  No tenderness, rebound or guarding.  PELVIC: Normal appearing external genitalia and urethral meatus; normal appearing vaginal mucosa and cervix.  No abnormal vaginal discharge noted.  Pap smear obtained.  Normal uterine size, no other palpable masses, no uterine or adnexal tenderness.  Performed in the presence of a chaperone.   Assessment and Plan:     1. Screening for STDs (sexually transmitted diseases) STI screen done, will follow up results and manage accordingly. - Cytology - PAP - Hepatitis B surface antigen - Hepatitis C antibody - HIV Antibody (routine testing w rflx) - RPR  2. Well woman exam with routine gynecological exam - Cytology - PAP Will follow up results of pap smear and manage accordingly. Normal breast examination today, she was advised to perform periodic self breast examinations.  Mammogram is up to date. Please refer to After Visit Summary for other counseling recommendations.      Jaynie Collins, MD, FACOG Obstetrician & Gynecologist, [facility] for [employer], [facility]

## 2022-11-21 LAB — HEPATITIS B SURFACE ANTIGEN: Hepatitis B Surface Ag: NEGATIVE

## 2022-11-21 LAB — RPR: RPR Ser Ql: NONREACTIVE

## 2022-11-21 LAB — HIV ANTIBODY (ROUTINE TESTING W REFLEX): HIV Screen 4th Generation wRfx: NONREACTIVE

## 2022-11-21 LAB — HEPATITIS C ANTIBODY: Hep C Virus Ab: NONREACTIVE

## 2022-11-24 ENCOUNTER — Other Ambulatory Visit (HOSPITAL_COMMUNITY): Payer: 59 | Admitting: Psychiatry

## 2022-11-24 ENCOUNTER — Other Ambulatory Visit (HOSPITAL_COMMUNITY): Payer: 59 | Attending: Psychiatry | Admitting: Family

## 2022-11-24 ENCOUNTER — Encounter (HOSPITAL_COMMUNITY): Payer: Self-pay | Admitting: Psychiatry

## 2022-11-24 ENCOUNTER — Encounter (HOSPITAL_COMMUNITY): Payer: Self-pay

## 2022-11-24 DIAGNOSIS — Z79899 Other long term (current) drug therapy: Secondary | ICD-10-CM | POA: Diagnosis not present

## 2022-11-24 DIAGNOSIS — F411 Generalized anxiety disorder: Secondary | ICD-10-CM | POA: Diagnosis present

## 2022-11-24 DIAGNOSIS — Z6379 Other stressful life events affecting family and household: Secondary | ICD-10-CM | POA: Diagnosis not present

## 2022-11-24 DIAGNOSIS — Z818 Family history of other mental and behavioral disorders: Secondary | ICD-10-CM | POA: Diagnosis not present

## 2022-11-24 DIAGNOSIS — F331 Major depressive disorder, recurrent, moderate: Secondary | ICD-10-CM | POA: Diagnosis not present

## 2022-11-24 DIAGNOSIS — Z5681 Sexual harassment on the job: Secondary | ICD-10-CM | POA: Insufficient documentation

## 2022-11-24 DIAGNOSIS — Z87891 Personal history of nicotine dependence: Secondary | ICD-10-CM | POA: Insufficient documentation

## 2022-11-24 NOTE — Progress Notes (Signed)
Virtual Visit via Video Note   I connected with Mackenzie Mcgee on 11/24/22 at  9:00 AM EDT by a video enabled telemedicine application and verified that I am speaking with the correct person using two identifiers.   At orientation to the IOP program, Case Manager discussed the limitations of evaluation and management by telemedicine and the availability of in person appointments. The patient expressed understanding and agreed to proceed with virtual visits throughout the duration of the program.   Location:  Patient: Patient Home Provider: [facility]   History of Present Illness: MDD and GAD   Observations/Objective: Check In: Case Manager checked in with all participants to review discharge dates, insurance authorizations, work-related documents and needs from the treatment team regarding medications. Mackenzie Mcgee stated needs and engaged in discussion.    Initial Therapeutic Activity: Counselor facilitated a check-in with Mackenzie Mcgee to assess for safety, sobriety and medication compliance.  Counselor also inquired about Mackenzie Mcgee's current emotional ratings, as well as any significant changes in thoughts, feelings or behavior since previous check in.  Mackenzie Mcgee presented for session on time and was alert, oriented x5, with no evidence or self-report of active SI/HI or A/V H.  Mackenzie Mcgee reported compliance with medication and denied use of alcohol or illicit substances.  Mackenzie Mcgee reported scores of 7/10 for depression, 6/10 for anxiety, and 0/10 for anger/irritability.  Mackenzie Mcgee denied any recent outbursts or panic attacks.  Mackenzie Mcgee reported that a recent success was spending quality time with her daughter over the weekend.  Mackenzie Mcgee reported that a struggle has been dealing with worsening depression and anxiety.  She reported that a primary goal for her to pursue during her time in [facility] is to learn how to set boundaries and learn to speak up for herself.    Second Therapeutic Activity:  Counselor introduced [employer], [place] Chaplain to provide psychoeducation on topic of Grief and Loss with members today.  Mackenzie Mcgee began discussion by checking in with the group about their baseline mood today, general thoughts on what grief means to them and how it has affected them personally in the past.  Mackenzie Mcgee provided information on how the process of grief/loss can differ depending upon one's unique culture, and categories of loss one could experience (i.e. loss of a person, animal, relationship, job, identity, etc).  Mackenzie Mcgee encouraged members to be mindful of how pervasive loss can be, and how to recognize signs which could indicate that this is having an impact on one's overall mental health and wellbeing.  Intervention effectiveness could not be measured, as client did not participate.    Assessment and Plan: Counselor recommends that Mackenzie Mcgee remain in IOP treatment to better manage mental health symptoms, ensure stability and pursue completion of treatment plan goals. Counselor recommends adherence to crisis/safety plan, taking medications as prescribed, and following up with medical professionals if any issues arise.    Follow Up Instructions: Counselor will send Webex link for session tomorrow.  Mackenzie Mcgee was advised to call back or seek an in-person evaluation if the symptoms worsen or if the condition fails to improve as anticipated.   Collaboration of Care:   Medication Management AEB Dr. Cyndie Chime or [name], NP                                          Case Manager AEB [name], CNA    Patient/Guardian was advised Release  of Information must be obtained prior to any record release in order to collaborate their care with an outside provider. Patient/Guardian was advised if they have not already done so to contact the registration department to sign all necessary forms in order for [place] to release information regarding their care.    Consent: Patient/Guardian gives verbal  consent for treatment and assignment of benefits for services provided during this visit. Patient/Guardian expressed understanding and agreed to proceed.   I provided 180 minutes of non-face-to-face time during this encounter.   Mackenzie Stain, LCSW, LCAS 11/24/22
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
## 2022-11-25 ENCOUNTER — Other Ambulatory Visit (HOSPITAL_COMMUNITY): Payer: 59 | Admitting: Psychiatry

## 2022-11-25 DIAGNOSIS — F411 Generalized anxiety disorder: Secondary | ICD-10-CM

## 2022-11-25 DIAGNOSIS — F331 Major depressive disorder, recurrent, moderate: Secondary | ICD-10-CM
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
## 2022-11-26 ENCOUNTER — Telehealth (HOSPITAL_COMMUNITY): Payer: Self-pay | Admitting: Psychiatry

## 2022-11-26 ENCOUNTER — Other Ambulatory Visit (HOSPITAL_COMMUNITY): Payer: 59 | Admitting: Psychiatry

## 2022-11-26 LAB — CYTOLOGY - PAP
Chlamydia: NEGATIVE
Comment: NEGATIVE
Comment: NEGATIVE
Comment: NEGATIVE
Comment: NORMAL
Diagnosis: NEGATIVE
High risk HPV: NEGATIVE
Neisseria Gonorrhea: NEGATIVE
Trichomonas: NEGATIVE

## 2022-11-27 ENCOUNTER — Other Ambulatory Visit (HOSPITAL_COMMUNITY): Payer: 59 | Admitting: Family

## 2022-11-27 DIAGNOSIS — F331 Major depressive disorder, recurrent, moderate: Secondary | ICD-10-CM

## 2022-11-27 DIAGNOSIS — F411 Generalized anxiety disorder: Secondary | ICD-10-CM | POA: Diagnosis not present

## 2022-11-27 NOTE — Progress Notes (Signed)
Virtual Visit via Video Note   I connected with Mackenzie Mcgee on 11/27/22 at  9:00 AM EDT by a video enabled telemedicine application and verified that I am speaking with the correct person using two identifiers.   At orientation to the IOP program, Case Manager discussed the limitations of evaluation and management by telemedicine and the availability of in person appointments. The patient expressed understanding and agreed to proceed with virtual visits throughout the duration of the program.   Location:  Patient: Patient Home Provider: OPT BH Office   History of Present Illness: MDD and GAD   Observations/Objective: Check In: Case Manager checked in with all participants to review discharge dates, insurance authorizations, work-related documents and needs from the treatment team regarding medications. Mackenzie Mcgee stated needs and engaged in discussion.    Initial Therapeutic Activity: Counselor facilitated a check-in with Mackenzie Mcgee to assess for safety, sobriety and medication compliance.  Counselor also inquired about Mackenzie Mcgee's current emotional ratings, as well as any significant changes in thoughts, feelings or behavior since previous check in.  Mackenzie Mcgee presented for session on time and was alert, oriented x5, with no evidence or self-report of active SI/HI or A/V H.  Mackenzie Mcgee reported compliance with medication and denied use of alcohol or illicit substances.  Mackenzie Mcgee reported scores of 6/10 for depression, 5/10 for anxiety, and 0/10 for anger/irritability.  Mackenzie Mcgee denied any recent outbursts or panic attacks.  Mackenzie Mcgee reported that a struggle was having to miss group yesterday in order to take care of her sick daughter.  Mackenzie Mcgee reported that she has also developed a cough, and is monitoring symptoms closely.  Mackenzie Mcgee reported that her goal today is to take time to rest, and watch a movie for self-care.        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  Alexandra provided information on history of her local agency, mission statement, and the variety of unique services offered which group members might find beneficial to engage in, including both virtual and in-person support groups, as well as peer support program for mentoring.  Alexandra offered time to answer member's questions regarding services and encouraged them to consider utilizing these services to assist in working towards their individual wellness goals.  Intervention effectiveness could not be measured, as client did not participate in discussion.    Third Therapeutic Activity: Counselor introduced topic of creating mental health maintenance plan today.  Counselor provided handout on subject to members, which stressed the importance of maintaining one's mental health in a similar way to using diet and exercise to ensure physical health.  Counselor walked members through process of identifying triggers which could worsen symptoms, including specific people, places, and things one needs to avoid.  Members were also tasked with identifying warning signs such as thoughts, feelings, or behaviors which could indicate mental health is at increased risk.  Counselor also facilitated conversation on self-care activities and coping strategies which members have previously utilized in the past, are currently using in daily routine, or plan to use soon to assist with managing problems or symptoms when/if they appear.  Counselor encouraged members to revisit their maintenance plan often and make changes as needed to ensure day to day stability.  Intervention effectiveness was mixed, as Mackenzie Mcgee reporting that she wasn't feeling well.  Mackenzie Mcgee reported that she could not focus enough to think of self-care activities or coping skills to add to her plan today.  She  reported identification of warning signs to be more  mindful of during treatment, including sleepiness, irritability, and isolation from others.    Assessment and Plan: Counselor recommends that Mackenzie Mcgee remain in IOP treatment to better manage mental health symptoms, ensure stability and pursue completion of treatment plan goals. Counselor recommends adherence to crisis/safety plan, taking medications as prescribed, and following up with medical professionals if any issues arise.    Follow Up Instructions: Counselor will send Webex link for session tomorrow.  Mackenzie Mcgee was advised to call back or seek an in-person evaluation if the symptoms worsen or if the condition fails to improve as anticipated.   Collaboration of Care:   Medication Management AEB Dr. Cyndie Chime or [name], NP                                          Case Manager AEB [name], CNA    Patient/Guardian was advised Release of Information must be obtained prior to any record release in order to collaborate their care with an outside provider. Patient/Guardian was advised if they have not already done so to contact the registration department to sign all necessary forms in order for [place] to release information regarding their care.    Consent: Patient/Guardian gives verbal consent for treatment and assignment of benefits for services provided during this visit. Patient/Guardian expressed understanding and agreed to proceed.   I provided 180 minutes of non-face-to-face time during this encounter.   [name], LCSW, LCAS 11/27/22

## 2022-11-30 ENCOUNTER — Telehealth (HOSPITAL_COMMUNITY): Payer: Self-pay | Admitting: Psychiatry

## 2022-11-30 ENCOUNTER — Other Ambulatory Visit: Payer: Self-pay | Admitting: Family Medicine

## 2022-11-30 ENCOUNTER — Other Ambulatory Visit (HOSPITAL_COMMUNITY): Payer: 59 | Admitting: Psychiatry

## 2022-11-30 DIAGNOSIS — F411 Generalized anxiety disorder: Secondary | ICD-10-CM

## 2022-11-30 DIAGNOSIS — F331 Major depressive disorder, recurrent, moderate: Secondary | ICD-10-CM

## 2022-12-01 ENCOUNTER — Other Ambulatory Visit (HOSPITAL_COMMUNITY): Payer: 59 | Admitting: Psychiatry

## 2022-12-01 ENCOUNTER — Telehealth (HOSPITAL_COMMUNITY): Payer: Self-pay | Admitting: Psychiatry

## 2022-12-02 ENCOUNTER — Other Ambulatory Visit (HOSPITAL_COMMUNITY): Payer: 59

## 2022-12-02 ENCOUNTER — Telehealth (HOSPITAL_COMMUNITY): Payer: Self-pay | Admitting: Psychiatry

## 2022-12-03 ENCOUNTER — Other Ambulatory Visit (HOSPITAL_COMMUNITY): Payer: 59 | Admitting: Psychiatry

## 2022-12-03 ENCOUNTER — Telehealth (HOSPITAL_COMMUNITY): Payer: Self-pay | Admitting: Psychiatry

## 2022-12-03 NOTE — Patient Instructions (Signed)
D:  Pt requesting discharge today.  A:  Discharge from virtual MH-IOP.  Contact Dr. Willodean Rosenthal office for a follow up appointment.  Strongly recommend patient to follow up with a therapist:  Milicent Day, LCSW [phone] or Suzzanne Cloud, [facility] [phone], or Norma Fredrickson [phone].  R:  Patient receptive.
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
## 2022-12-04 ENCOUNTER — Other Ambulatory Visit (HOSPITAL_COMMUNITY): Payer: 59

## 2022-12-07 ENCOUNTER — Other Ambulatory Visit (HOSPITAL_COMMUNITY): Payer: 59

## 2022-12-08 ENCOUNTER — Other Ambulatory Visit (HOSPITAL_COMMUNITY): Payer: 59

## 2022-12-08 ENCOUNTER — Encounter (HOSPITAL_COMMUNITY): Payer: Self-pay | Admitting: Family

## 2022-12-08 NOTE — Progress Notes (Signed)
Virtual Visit via Telephone Note  I connected with Lucas Mallow on 12/08/22 at  9:00 AM EDT by telephone and verified that I am speaking with the correct person using two identifiers.  Location: Patient: -no answer Provider: Office   I discussed the limitations, risks, security and privacy concerns of performing an evaluation and management service by telephone and the availability of in person appointments. I also discussed with the patient that there may be a patient responsible charge related to this service. The patient expressed understanding and agreed to proceed.    I discussed the assessment and treatment plan with the patient. The patient was provided an opportunity to ask questions and all were answered. The patient agreed with the plan and demonstrated an understanding of the instructions.   The patient was advised to call back or seek an in-person evaluation if the symptoms worsen or if the condition fails to improve as anticipated.  I provided 0 minutes of non-face-to-face time during this encounter.   Oneta Rack, NP   [facility] Intensive [facility] Program Discharge Summary  LUCYNDA KIL [phone]  Admission date: 11/24/2022 Discharge date: 12/08/2022  Reason for admission: Per admission assessment note: Juaquina Meehl is a 42 year old African-American female presents due to multiple stressors.  She reports she is diagnosed with major depressive disorder ( MDD) and generalized anxiety disorder ( GAD).  States she currently followed by a psychiatrist Arfeen where she is taking her medications as indicated.  She reports she is currently prescribed Wellbutrin and hydroxyzine.  States her primary care provider recently increased her Wellbutrin to 300 mg however states she is having diarrhea which is the only medication that she is adjusted recently.  She reports this is day 4 of taking Wellbutrin at the higher dose.  Discussed  consideration for continuing medications for 2-3 more days.  If diarrhea persist will titrate accordingly.  She was receptive to the plan    Progress in Program Toward Treatment Goals: Progressing patient attended and participated with daily group session x 1 week.  No concerns related to suicidality.  This provider attempted to follow-up with patient for discharge disposition.  Was reported she is unable to complete the program at this time.  No refills made available.  Patient has outpatient resources available with follow-up appointment with psychiatrist Arfeen.  Voicemail left for additional follow-up.  Progress (rationale): Ongoing, Take all of you medications as prescribed by your mental healthcare provider.  Report any adverse effects and reactions from your medications to your outpatient provider promptly.  Do not engage in alcohol and or illegal drug use while on prescription medicines. Keep all scheduled appointments. This is to ensure that you are getting refills on time and to avoid any interruption in your medication.  If you are unable to keep an appointment call to reschedule.  Be sure to follow up with resources and follow ups given. In the event of worsening symptoms call the crisis hotline, 911, and or go to the nearest emergency department for appropriate evaluation and treatment of symptoms. Follow-up with your primary care provider for your medical issues, concerns and or health care needs.    Collaboration of Care: Medication Management AEB Wellbutrin and Hydroxyzine  and Primary Care Provider AEB MD Afreen and [facility]  Patient/Guardian was advised Release of Information must be obtained prior to any record release in order to collaborate their care with an outside provider. Patient/Guardian was advised if they have not  already done so to contact the registration department to sign all necessary forms in order for [place] to release information regarding their care.    Consent: Patient/Guardian gives verbal consent for treatment and assignment of benefits for services provided during this visit. Patient/Guardian expressed understanding and agreed to proceed.   Oneta Rack, NP 12/08/2022

## 2022-12-09 ENCOUNTER — Other Ambulatory Visit (HOSPITAL_COMMUNITY): Payer: 59

## 2022-12-10 ENCOUNTER — Other Ambulatory Visit (HOSPITAL_COMMUNITY): Payer: 59

## 2022-12-11 ENCOUNTER — Other Ambulatory Visit (HOSPITAL_COMMUNITY): Payer: 59

## 2022-12-14 ENCOUNTER — Other Ambulatory Visit (HOSPITAL_COMMUNITY): Payer: 59

## 2022-12-15 ENCOUNTER — Other Ambulatory Visit (HOSPITAL_COMMUNITY): Payer: 59

## 2022-12-16 ENCOUNTER — Other Ambulatory Visit (HOSPITAL_COMMUNITY): Payer: 59

## 2022-12-17 ENCOUNTER — Other Ambulatory Visit (HOSPITAL_COMMUNITY): Payer: 59

## 2022-12-18 ENCOUNTER — Other Ambulatory Visit (HOSPITAL_COMMUNITY): Payer: 59

## 2022-12-21 ENCOUNTER — Other Ambulatory Visit (HOSPITAL_COMMUNITY): Payer: 59

## 2022-12-22 ENCOUNTER — Other Ambulatory Visit (HOSPITAL_COMMUNITY): Payer: 59

## 2022-12-23 ENCOUNTER — Other Ambulatory Visit (HOSPITAL_COMMUNITY): Payer: 59

## 2022-12-24 ENCOUNTER — Other Ambulatory Visit (HOSPITAL_COMMUNITY): Payer: 59

## 2022-12-25 ENCOUNTER — Other Ambulatory Visit (HOSPITAL_COMMUNITY): Payer: 59

## 2022-12-28 ENCOUNTER — Other Ambulatory Visit (HOSPITAL_COMMUNITY): Payer: 59

## 2022-12-29 ENCOUNTER — Encounter: Payer: Self-pay | Admitting: Family Medicine

## 2022-12-29 ENCOUNTER — Other Ambulatory Visit (HOSPITAL_COMMUNITY): Payer: 59

## 2022-12-30 ENCOUNTER — Ambulatory Visit: Payer: 59 | Admitting: Family Medicine

## 2022-12-31 ENCOUNTER — Other Ambulatory Visit (HOSPITAL_COMMUNITY): Payer: 59

## 2023-01-01 ENCOUNTER — Other Ambulatory Visit (HOSPITAL_COMMUNITY): Payer: 59

## 2023-01-13 ENCOUNTER — Other Ambulatory Visit (HOSPITAL_COMMUNITY): Payer: Self-pay

## 2023-01-18 IMAGING — MR MR HEAD WO/W CM
13 series · 48 of 48 positions shown · IV contrast (gadavist)
Comparison: Noncontrast head CT and CT angiogram head/neck
09/17/2020.

CLINICAL DATA: Provided history: Nystagmus or other irregular eye
movement; left eye ptosis isolated.

EXAM:
MRI HEAD WITHOUT AND WITH CONTRAST
TECHNIQUE: Multiplanar, multiecho pulse sequences of the brain and surrounding
structures were obtained without and with intravenous contrast.
CONTRAST:  8mL GADAVIST GADOBUTROL 1 MMOL/ML IV SOLN

[Series 5: DWI · axial · 3.0mm · 1.36mm/px · z∈[-70,+70]mm · 5 of 96 slices shown (1 of 2)]
[im 1/96]
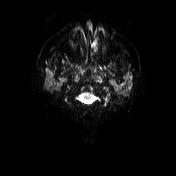
[im 24/96]
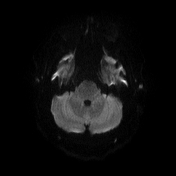
[im 48/96]
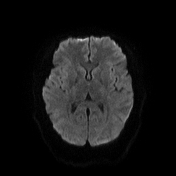
[im 72/96]
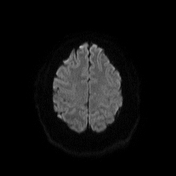
[im 96/96]
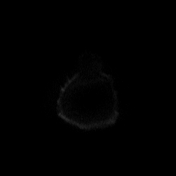

[Series 6: DWI · axial · 3.0mm · 1.36mm/px · z∈[-70,+70]mm · 2 of 47 slices shown (2 of 2)]
[im 1/47]
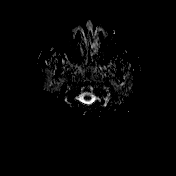
[im 47/47]
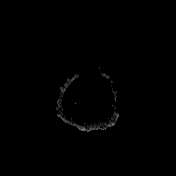

[Series 7: T1 · sagittal · 5.0mm · 0.75mm/px · 2 of 24 slices shown (1 of 2)]
[im 1/24]
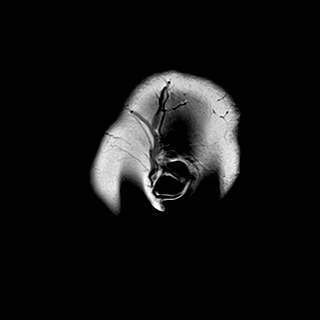
[im 24/24]
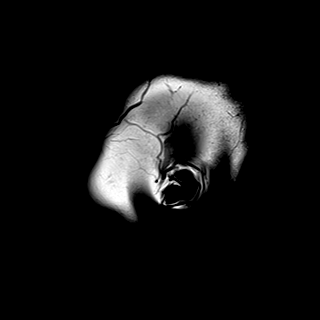

[Series 8: T2 · axial · 5.0mm · 0.62mm/px · z∈[-81,+81]mm · 2 of 26 slices shown]
[im 1/26]
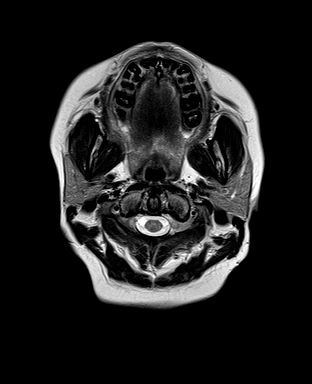
[im 26/26]
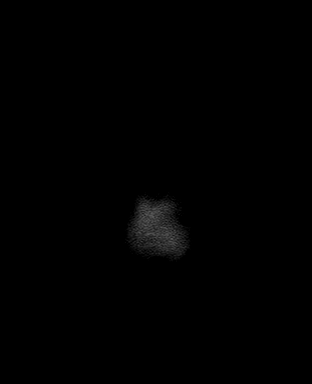

[Series 9: FLAIR · axial · 3.0mm · 0.75mm/px · z∈[-76,+77]mm · 3 of 52 slices shown]
[im 1/52]
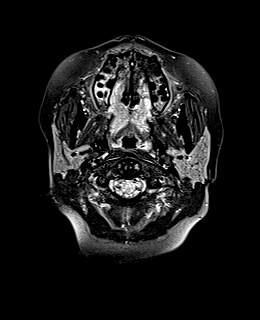
[im 26/52]
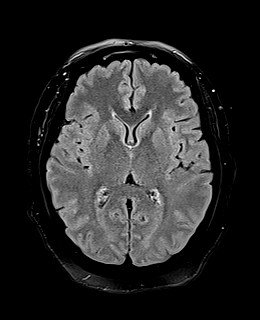
[im 52/52]
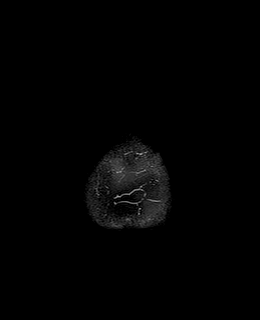

[Series 10: swi_images · axial · 3.0mm · 0.75mm/px · z∈[-88,+89]mm · 4 of 60 slices shown]
[im 1/60]
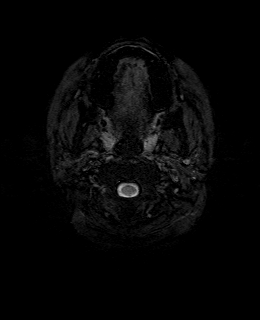
[im 20/60]
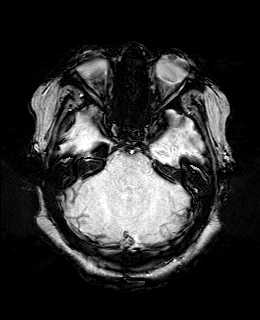
[im 40/60]
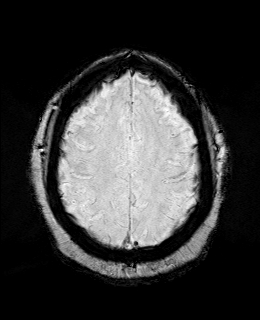
[im 60/60]
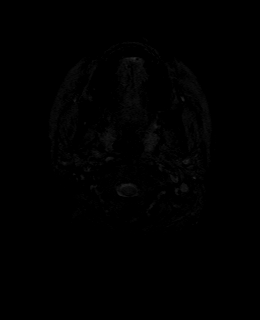

[Series 12: T1 · axial · 1.0mm · 0.94mm/px · z∈[-71,+72]mm · 9 of 144 slices shown (2 of 2)]
[im 1/144]
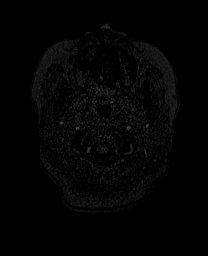
[im 18/144]
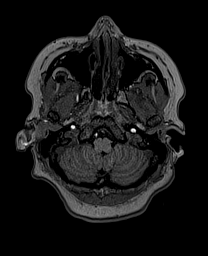
[im 36/144]
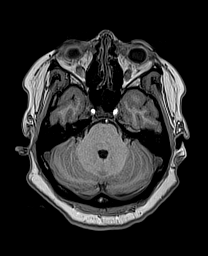
[im 54/144]
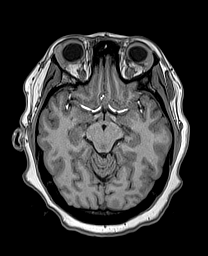
[im 72/144]
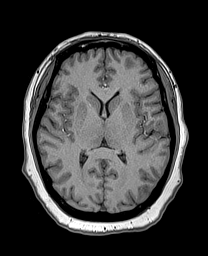
[im 90/144]
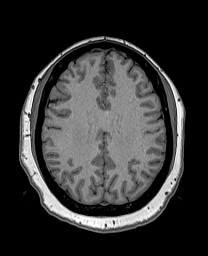
[im 108/144]
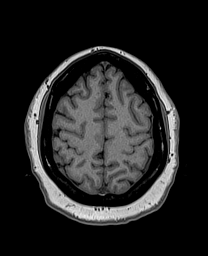
[im 126/144]
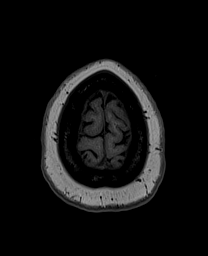
[im 144/144]
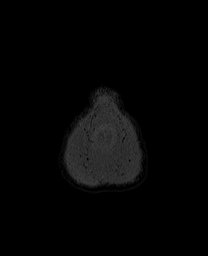

[Series 13: cor dwi_tracew · coronal · 5.0mm · 1.53mm/px · 4 of 56 slices shown]
[im 1/56]
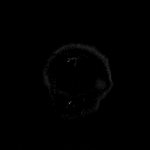
[im 19/56]
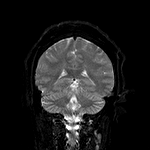
[im 37/56]
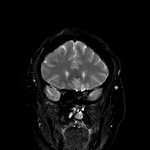
[im 56/56]
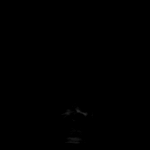

[Series 14: cor dwi_adc · coronal · 5.0mm · 1.53mm/px · 2 of 28 slices shown]
[im 1/28]
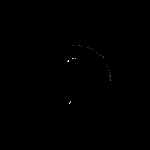
[im 28/28]
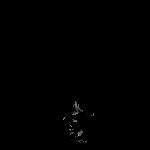

[Series 15: T2 post-contrast · coronal · 5.0mm · 0.57mm/px · 2 of 32 slices shown]
[im 1/32]
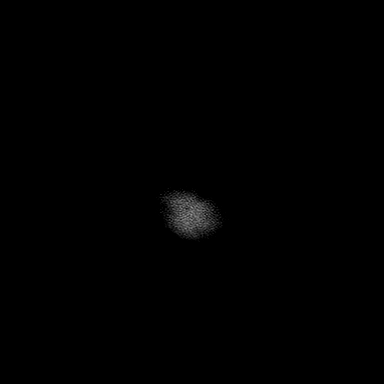
[im 32/32]
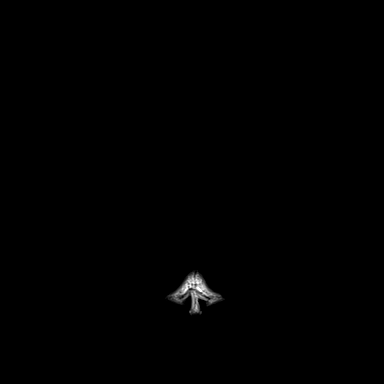

[Series 16: T1 post-contrast · axial · 1.0mm · 0.94mm/px · z∈[-71,+72]mm · 9 of 144 slices shown (1 of 3)]
[im 1/144]
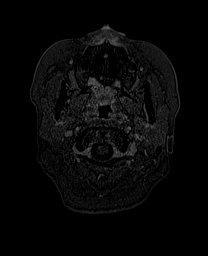
[im 18/144]
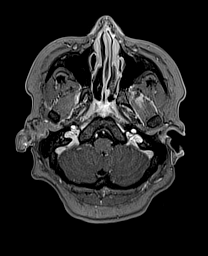
[im 36/144]
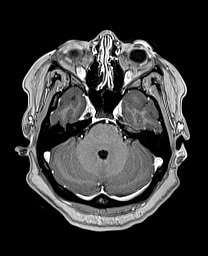
[im 54/144]
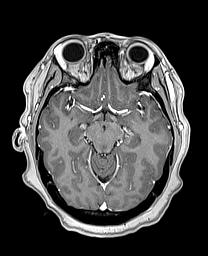
[im 72/144]
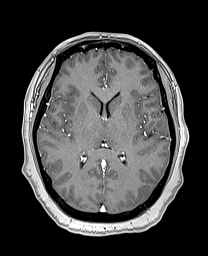
[im 90/144]
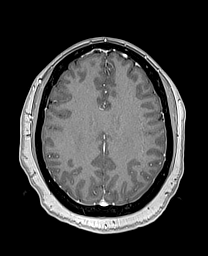
[im 108/144]
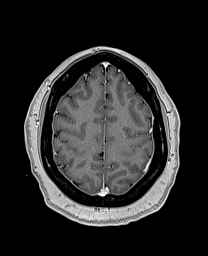
[im 126/144]
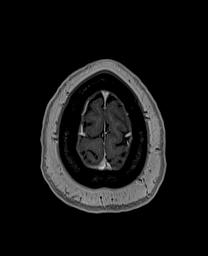
[im 144/144]
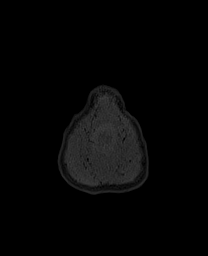

[Series 17: T1 post-contrast · coronal · 5.0mm · 0.43mm/px · 2 of 32 slices shown (2 of 3)]
[im 1/32]
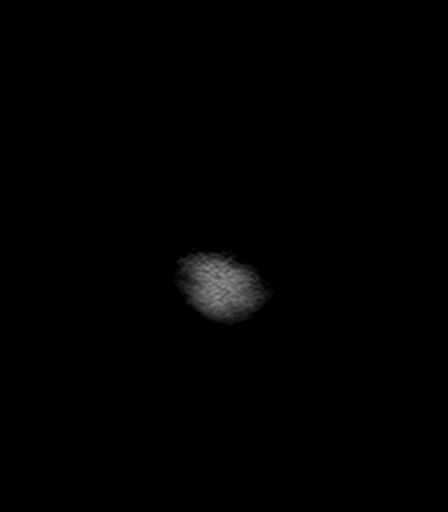
[im 32/32]
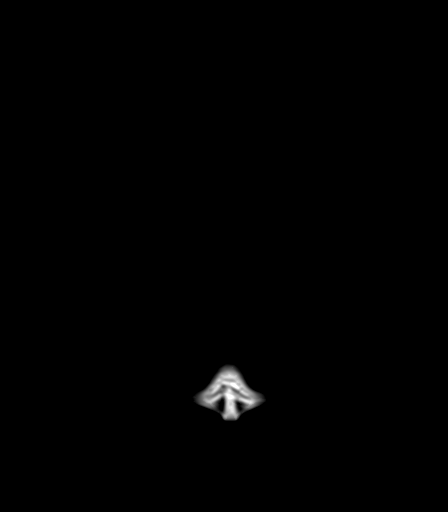

[Series 18: T1 post-contrast · sagittal · 5.0mm · 0.75mm/px · 2 of 24 slices shown (3 of 3)]
[im 1/24]
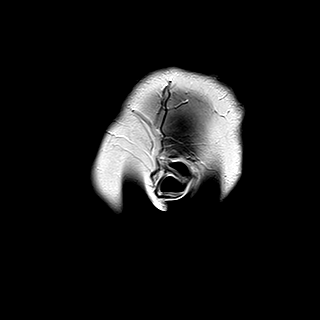
[im 24/24]
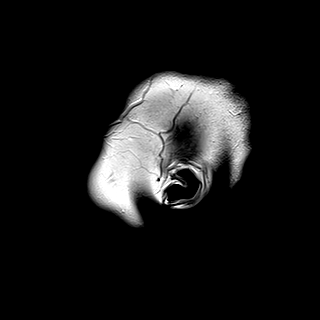

[48 of 48 positions shown; findings below may reference images not displayed]

FINDINGS: Brain:

Cerebral volume is normal.

No cortical encephalomalacia is identified. No significant white
matter disease.

There is no acute infarct.

No evidence of an intracranial mass.

No chronic intracranial blood products.

No extra-axial fluid collection.

No midline shift.

No abnormal intracranial enhancement.

Vascular: Expected proximal arterial flow voids.

Skull and upper cervical spine: No focal marrow lesion.

Sinuses/Orbits: Visualized orbits show no acute finding. No
significant paranasal sinus disease.

Other: Trace right mastoid effusion.
IMPRESSION: Unremarkable MRI appearance of the brain. No evidence of acute
intracranial abnormality.

Trace right mastoid effusion.

## 2023-01-25 ENCOUNTER — Other Ambulatory Visit: Payer: Self-pay

## 2023-01-26 ENCOUNTER — Telehealth: Payer: Self-pay

## 2023-01-26 NOTE — Telephone Encounter (Signed)
Called pt scheduled appt for weight check and she has new insurance.

## 2023-01-26 NOTE — Telephone Encounter (Signed)
Needs renewal PA for Mounjaro, Pt overdue for appt.

## 2023-01-27 ENCOUNTER — Encounter: Payer: Self-pay | Admitting: Family Medicine

## 2023-01-27 ENCOUNTER — Other Ambulatory Visit: Payer: Self-pay

## 2023-01-27 ENCOUNTER — Ambulatory Visit: Payer: BC Managed Care – PPO | Admitting: Family Medicine

## 2023-01-27 VITALS — BP 105/68 | HR 80 | Temp 98.0°F | Resp 16 | Ht 64.0 in | Wt 136.6 lb

## 2023-01-27 DIAGNOSIS — F331 Major depressive disorder, recurrent, moderate: Secondary | ICD-10-CM

## 2023-01-27 DIAGNOSIS — E1165 Type 2 diabetes mellitus with hyperglycemia: Secondary | ICD-10-CM | POA: Diagnosis not present

## 2023-01-27 DIAGNOSIS — Z794 Long term (current) use of insulin: Secondary | ICD-10-CM

## 2023-01-27 DIAGNOSIS — Z23 Encounter for immunization: Secondary | ICD-10-CM

## 2023-01-27 DIAGNOSIS — F411 Generalized anxiety disorder: Secondary | ICD-10-CM

## 2023-01-27 LAB — COMPREHENSIVE METABOLIC PANEL
ALT: 9 U/L (ref 0–35)
AST: 10 U/L (ref 0–37)
Albumin: 4.1 g/dL (ref 3.5–5.2)
Alkaline Phosphatase: 27 U/L — ABNORMAL LOW (ref 39–117)
BUN: 8 mg/dL (ref 6–23)
CO2: 25 meq/L (ref 19–32)
Calcium: 9.1 mg/dL (ref 8.4–10.5)
Chloride: 105 meq/L (ref 96–112)
Creatinine, Ser: 0.66 mg/dL (ref 0.40–1.20)
GFR: 109.54 mL/min (ref >60.00)
Glucose, Bld: 118 mg/dL — ABNORMAL HIGH (ref 70–99)
Potassium: 3.7 meq/L (ref 3.5–5.1)
Sodium: 138 meq/L (ref 135–145)
Total Bilirubin: 0.3 mg/dL (ref 0.2–1.2)
Total Protein: 6.5 g/dL (ref 6.0–8.3)

## 2023-01-27 LAB — LIPID PANEL
Cholesterol: 170 mg/dL (ref 0–200)
HDL: 71.8 mg/dL (ref >39.00)
LDL Cholesterol: 89 mg/dL (ref 0–99)
NonHDL: 98.64
Total CHOL/HDL Ratio: 2
Triglycerides: 47 mg/dL (ref 0.0–149.0)
VLDL: 9.4 mg/dL (ref 0.0–40.0)

## 2023-01-27 LAB — MICROALBUMIN / CREATININE URINE RATIO
Creatinine,U: 37.7 mg/dL
Microalb Creat Ratio: 1.9 mg/g (ref 0.0–30.0)
Microalb, Ur: <0.7 mg/dL (ref 0.0–1.9)

## 2023-01-27 LAB — HEMOGLOBIN A1C: Hgb A1c MFr Bld: 5.6 % (ref 4.6–6.5)

## 2023-01-27 MED ORDER — BUPROPION HCL ER (XL) 300 MG PO TB24: 300.0000 mg | ORAL_TABLET | Freq: Every day | ORAL | 3 refills | Status: DC

## 2023-01-27 MED ORDER — ROSUVASTATIN CALCIUM 5 MG PO TABS: 5.0000 mg | ORAL_TABLET | Freq: Every day | ORAL | 3 refills | Status: AC

## 2023-01-27 MED ORDER — HYDROXYZINE HCL 25 MG PO TABS
25.0000 mg | ORAL_TABLET | Freq: Three times a day (TID) | ORAL | 1 refills | Status: DC | PRN
Start: 2023-01-27 — End: 2024-02-11

## 2023-01-27 NOTE — Progress Notes (Signed)
Subjective:   Chief Complaint  Patient presents with   Follow-up    Mackenzie Mcgee is a 42 y.o. female here for follow-up of diabetes.   Mackenzie Mcgee does not routinely monitor her sugars. Patient does not require insulin.   Medications include: Mounjaro 12.5 mg weekly.  She would prefer not to lose any more weight. Diet is healthy.  Exercise: active with kids No CP or SOB.   Anxiety/depression Pt is currently being treated with Wellbutrin XL 300 mg/d.  Reports doing better since treatment.  She is in a much better situation with her job. No thoughts of harming self or others. No self-medication with alcohol, prescription drugs or illicit drugs. Pt is following with a counselor/psychologist.  Past Medical History:  Diagnosis Date   COVID-19 virus infection 07/2020   Depression, recurrent (HCC) 05/19/2021   Dissection of left carotid artery (HCC) 09/17/2020   DKA (diabetic ketoacidosis) (HCC) 09/16/2020   GAD (generalized anxiety disorder) 05/19/2021   Gestational diabetes    Morbid obesity (HCC)    Ptosis of left eyelid 09/17/2020   Type 2 diabetes mellitus with hyperglycemia, with long-term current use of insulin (HCC) 09/25/2020     Related testing: Retinal exam: Done Pneumovax: done  Objective:  BP 105/68 (BP Location: Left Arm, Patient Position: Sitting, Cuff Size: Normal)   Pulse 80   Temp 98 F (36.7 C) (Oral)   Resp 16   Ht 5\' 4"  (1.626 m)   Wt 136 lb 9.6 oz (62 kg)   SpO2 96%   BMI 23.45 kg/m  General:  Well developed, well nourished, in no apparent distress Skin:  Warm, no pallor or diaphoresis Head:  Normocephalic, atraumatic Eyes:  Pupils equal and round, sclera anicteric without injection  Lungs:  CTAB, no access msc use Cardio:  RRR, no bruits, no LE edema Musculoskeletal:  Symmetrical muscle groups noted without atrophy or deformity Neuro:  Sensation intact to pinprick on feet Psych: Age appropriate judgment and insight  Assessment:    Type 2 diabetes mellitus with hyperglycemia, with long-term current use of insulin (HCC) - Plan: rosuvastatin (CRESTOR) 5 MG tablet, Comprehensive metabolic panel, Lipid panel, Hemoglobin A1c, Microalbumin / creatinine urine ratio  GAD (generalized anxiety disorder) - Plan: hydrOXYzine (ATARAX) 25 MG tablet, buPROPion (WELLBUTRIN XL) 300 MG 24 hr tablet  MDD (major depressive disorder), recurrent episode, moderate (HCC) - Plan: buPROPion (WELLBUTRIN XL) 300 MG 24 hr tablet  Need for influenza vaccination - Plan: Flu vaccine trivalent PF, 6mos and older(Flulaval,Afluria,Fluarix,Fluzone)   Plan:   Chronic, stable.  Continue Mounjaro 12.5 mg weekly.  Other orders as above.  Counseled on diet and exercise. Chronic, stable.  Continue Wellbutrin 300 mg daily.  Continue hydroxyzine as needed. Chronic, stable.  Continue Wellbutrin as above, continue with the therapy/counseling team. Flu shot today. F/u in 6 mo. The patient voiced understanding and agreement to the plan.  Jilda Roche [place], DO 01/27/23 11:48 AM

## 2023-01-27 NOTE — Patient Instructions (Addendum)
Keep the diet clean and stay active.  Give us 2-3 business days to get the results of your labs back.   Let us know if you need anything.  

## 2023-01-27 NOTE — Telephone Encounter (Signed)
PA initiated via Covermymeds; KEY; [id number]. Awaiting determination.

## 2023-01-29 NOTE — Telephone Encounter (Signed)
PA approved.   The case has been Approved from [id number] Authorization Expiration Date: 01/27/2024

## 2023-03-09 ENCOUNTER — Encounter: Payer: Self-pay | Admitting: Family Medicine

## 2023-03-09 NOTE — Telephone Encounter (Signed)
 Care team updated and letter sent for eye exam notes.

## 2023-04-01 ENCOUNTER — Other Ambulatory Visit: Payer: Self-pay | Admitting: Family Medicine

## 2023-04-13 ENCOUNTER — Ambulatory Visit (INDEPENDENT_AMBULATORY_CARE_PROVIDER_SITE_OTHER): Payer: BC Managed Care – PPO | Admitting: Family Medicine

## 2023-04-13 ENCOUNTER — Encounter: Payer: Self-pay | Admitting: Family Medicine

## 2023-04-13 VITALS — BP 112/76 | HR 91 | Temp 98.0°F | Resp 16 | Ht 64.0 in | Wt 136.0 lb

## 2023-04-13 DIAGNOSIS — K2900 Acute gastritis without bleeding: Secondary | ICD-10-CM

## 2023-04-13 MED ORDER — ONDANSETRON 4 MG PO TBDP: 4.0000 mg | ORAL_TABLET | Freq: Three times a day (TID) | ORAL | 0 refills | Status: DC | PRN

## 2023-04-13 MED ORDER — DICYCLOMINE HCL 10 MG PO CAPS: ORAL_CAPSULE | ORAL | 0 refills | Status: DC

## 2023-04-13 MED ORDER — TIRZEPATIDE 10 MG/0.5ML ~~LOC~~ SOAJ: 10.0000 mg | SUBCUTANEOUS | 2 refills | Status: DC

## 2023-04-13 NOTE — Progress Notes (Signed)
Chief Complaint  Patient presents with   Nausea    Discuss Nausea and fever     Subjective Mackenzie Mcgee is a 43 y.o. female who presents with vomiting and diarrhea Symptoms began 2 d.  Patient has cramping, diarrhea, and nausea, runny nose, low grade fevers.  Patient denies vomiting, arthralgias, myalgias, and cough Treatment to date: Tylenol Sick contacts:  daughter  Past Medical History:  Diagnosis Date   COVID-19 virus infection 07/2020   Depression, recurrent (HCC) 05/19/2021   Dissection of left carotid artery (HCC) 09/17/2020   DKA (diabetic ketoacidosis) (HCC) 09/16/2020   GAD (generalized anxiety disorder) 05/19/2021   Gestational diabetes    Morbid obesity (HCC)    Ptosis of left eyelid 09/17/2020   Type 2 diabetes mellitus with hyperglycemia, with long-term current use of insulin (HCC) 09/25/2020    Exam BP 112/76   Pulse 91   Temp 98 F (36.7 C)   Resp 16   Ht 5\' 4"  (1.626 m)   Wt 136 lb (61.7 kg)   SpO2 98%   BMI 23.34 kg/m  General:  well developed, well hydrated, in no apparent distress Skin:  warm, no pallor or diaphoresis, no rashes Throat/Pharynx:  lips and gingiva without lesion; tongue and uvula midline; non-inflamed pharynx; no exudates or postnasal drainage Lungs:  clear to auscultation, breath sounds equal bilaterally, no respiratory distress, no wheezes Cardio:  RRR Abdomen:  abdomen soft, diffuse but mild TTP, no guarding; bowel sounds normal; no masses or organomegaly Psych: Appropriate judgement/insight  Assessment and Plan  Acute gastritis without hemorrhage, unspecified gastritis type - Plan: ondansetron (ZOFRAN-ODT) 4 MG disintegrating tablet, dicyclomine (BENTYL) 10 MG capsule  Bentyl for cramping, Zofran for nausea.  Push fluids. Avoid aggravating foods/triggers. F/u if symptoms fail to improve, sooner if worsening. The patient voiced understanding and agreement to the plan.  Jilda Roche [place], DO 04/13/23  12:02  PM

## 2023-04-13 NOTE — Patient Instructions (Signed)
As long as you are vomiting or having diarrhea, please drink fluids with electrolytes like Gatorade, Powerade, or Pedialyte (if you can stomach the taste). Drink these along with water mainly.   Let us know if you need anything.

## 2023-04-17 ENCOUNTER — Telehealth: Payer: BC Managed Care – PPO | Admitting: Nurse Practitioner

## 2023-04-17 DIAGNOSIS — R509 Fever, unspecified: Secondary | ICD-10-CM

## 2023-04-17 NOTE — Patient Instructions (Signed)
Lucas Mallow, thank you for joining Claiborne Rigg, NP for today's virtual visit.  While this provider is not your primary care provider (PCP), if your PCP is located in our provider database this encounter information will be shared with them immediately following your visit.   A [facility] MyChart account gives you access to today's visit and all your visits, tests, and labs performed at [facility] " click here if you don't have a [facility] MyChart account or go to [email or web address]  Consent: (Patient) Mackenzie Mcgee provided verbal consent for this virtual visit at the beginning of the encounter.  Current Medications:  Current Outpatient Medications:    aspirin EC 81 MG EC tablet, Take 1 tablet (81 mg total) by mouth daily. Swallow whole., Disp: 30 tablet, Rfl: 11   blood glucose meter kit and supplies KIT, Dispense based on patient and insurance preference. Use up to four times daily as directed., Disp: 1 each, Rfl: 0   buPROPion (WELLBUTRIN XL) 300 MG 24 hr tablet, Take 1 tablet (300 mg total) by mouth daily., Disp: 90 tablet, Rfl: 3   dicyclomine (BENTYL) 10 MG capsule, Take 1 tab every 6 hours as needed for abdominal cramping., Disp: 30 capsule, Rfl: 0   fluticasone (FLONASE) 50 MCG/ACT nasal spray, Place 2 sprays into both nostrils daily., Disp: 16 g, Rfl: 6   hydrOXYzine (ATARAX) 25 MG tablet, Take 1-3 tablets (25-75 mg total) by mouth every 8 (eight) hours as needed for anxiety., Disp: 30 tablet, Rfl: 1   Lancets (ONETOUCH DELICA PLUS LANCET33G) MISC, USE AS DIRECTED UP TO FOUR  TIMES  DAILY, Disp: 100 each, Rfl: 6   levocetirizine (XYZAL) 5 MG tablet, Take 1 tablet (5 mg total) by mouth every evening., Disp: 30 tablet, Rfl: 2   ondansetron (ZOFRAN-ODT) 4 MG disintegrating tablet, Take 1 tablet (4 mg total) by mouth every 8 (eight) hours as needed for nausea or vomiting., Disp: 20 tablet, Rfl: 0   ONETOUCH VERIO test strip, USE AS DIRECTED UP TO  FOUR  TIMES  DAILY, Disp: 100 each, Rfl: 0   pantoprazole (PROTONIX) 40 MG tablet, Take 1 tablet (40 mg total) by mouth daily., Disp: 30 tablet, Rfl: 1   rosuvastatin (CRESTOR) 5 MG tablet, Take 1 tablet (5 mg total) by mouth daily., Disp: 90 tablet, Rfl: 3   tirzepatide (MOUNJARO) 10 MG/0.5ML Pen, Inject 10 mg into the skin once a week., Disp: 6 mL, Rfl: 2   Medications ordered in this encounter:  No orders of the defined types were placed in this encounter.    *If you need refills on other medications prior to your next appointment, please contact your pharmacy*  Follow-Up: Call back or seek an in-person evaluation if the symptoms worsen or if the condition fails to improve as anticipated.  [facility] [phone]  Other Instructions Needs to be seen in urgent care. Labs.    If you have been instructed to have an in-person evaluation today at a local Urgent Care facility, please use the link below. It will take you to a list of all of our available [facility], including address, phone number and hours of operation. Please do not delay care.  [facility]  If you or a family member do not have a primary care provider, use the link below to schedule a visit and establish care. When you choose a  primary care physician or advanced practice provider, you gain  a long-term partner in health. Find a Primary Care Provider  Learn more about [facility] in-office and virtual care options:  - Get Care Now

## 2023-04-17 NOTE — Progress Notes (Signed)
Virtual Visit Consent   Mackenzie Mcgee, you are scheduled for a virtual visit with a [facility] provider today. Just as with appointments in the office, your consent must be obtained to participate. Your consent will be active for this visit and any virtual visit you may have with one of our providers in the next 365 days. If you have a MyChart account, a copy of this consent can be sent to you electronically.  As this is a virtual visit, video technology does not allow for your provider to perform a traditional examination. This may limit your provider's ability to fully assess your condition. If your provider identifies any concerns that need to be evaluated in person or the need to arrange testing (such as labs, EKG, etc.), we will make arrangements to do so. Although advances in technology are sophisticated, we cannot ensure that it will always work on either your end or our end. If the connection with a video visit is poor, the visit may have to be switched to a telephone visit. With either a video or telephone visit, we are not always able to ensure that we have a secure connection.  By engaging in this virtual visit, you consent to the provision of healthcare and authorize for your insurance to be billed (if applicable) for the services provided during this visit. Depending on your insurance coverage, you may receive a charge related to this service.  I need to obtain your verbal consent now. Are you willing to proceed with your visit today? Mackenzie Mcgee has provided verbal consent on 04/17/2023 for a virtual visit (video or telephone). Claiborne Rigg, NP  Date: 04/17/2023 4:13 PM  Virtual Visit via Video Note   I, Claiborne Rigg, connected with  Mackenzie Mcgee  ([id number], [birth year]) on 04/17/23 at  5:00 PM EST by a video-enabled telemedicine application and verified that I am speaking with the correct person using two identifiers.  Location: Patient: Virtual Visit  Location Patient: Home Provider: Virtual Visit Location Provider: Home Office   I discussed the limitations of evaluation and management by telemedicine and the availability of in person appointments. The patient expressed understanding and agreed to proceed.    History of Present Illness: Mackenzie Mcgee is a 43 y.o. who identifies as a female who was assigned female at birth, and is being seen today for febrile illness.  Ms. Delatte states she was seen at the primary care clinic on 04-13-2023 with llor grade fevers, vomiting and diarrhea. Labs were not drawn. She was prescribed dicyclomine and zofran. Today she states the diarrhea and vomiting have resolved. Fevers are persistent despite using tylenol and motrin.  Temps  99-101.   Problems:  Patient Active Problem List   Diagnosis Date Noted   MDD (major depressive disorder), recurrent episode, moderate (HCC) 01/27/2023   GAD (generalized anxiety disorder) 05/19/2021   Depression, recurrent (HCC) 05/19/2021   Type 2 diabetes mellitus with hyperglycemia, with long-term current use of insulin (HCC) 09/25/2020   Obesity (BMI 30-39.9) 09/17/2020   Ptosis of left eyelid 09/17/2020   Dissection of left carotid artery (HCC) 09/17/2020   DKA (diabetic ketoacidosis) (HCC) 09/16/2020    Allergies: No Known Allergies Medications:  Current Outpatient Medications:    aspirin EC 81 MG EC tablet, Take 1 tablet (81 mg total) by mouth daily. Swallow whole., Disp: 30 tablet, Rfl: 11   blood glucose meter kit and supplies KIT, Dispense based on patient and insurance preference. Use up to  four times daily as directed., Disp: 1 each, Rfl: 0   buPROPion (WELLBUTRIN XL) 300 MG 24 hr tablet, Take 1 tablet (300 mg total) by mouth daily., Disp: 90 tablet, Rfl: 3   dicyclomine (BENTYL) 10 MG capsule, Take 1 tab every 6 hours as needed for abdominal cramping., Disp: 30 capsule, Rfl: 0   fluticasone (FLONASE) 50 MCG/ACT nasal spray, Place 2 sprays into both  nostrils daily., Disp: 16 g, Rfl: 6   hydrOXYzine (ATARAX) 25 MG tablet, Take 1-3 tablets (25-75 mg total) by mouth every 8 (eight) hours as needed for anxiety., Disp: 30 tablet, Rfl: 1   Lancets (ONETOUCH DELICA PLUS LANCET33G) MISC, USE AS DIRECTED UP TO FOUR  TIMES  DAILY, Disp: 100 each, Rfl: 6   levocetirizine (XYZAL) 5 MG tablet, Take 1 tablet (5 mg total) by mouth every evening., Disp: 30 tablet, Rfl: 2   ondansetron (ZOFRAN-ODT) 4 MG disintegrating tablet, Take 1 tablet (4 mg total) by mouth every 8 (eight) hours as needed for nausea or vomiting., Disp: 20 tablet, Rfl: 0   ONETOUCH VERIO test strip, USE AS DIRECTED UP TO FOUR  TIMES  DAILY, Disp: 100 each, Rfl: 0   pantoprazole (PROTONIX) 40 MG tablet, Take 1 tablet (40 mg total) by mouth daily., Disp: 30 tablet, Rfl: 1   rosuvastatin (CRESTOR) 5 MG tablet, Take 1 tablet (5 mg total) by mouth daily., Disp: 90 tablet, Rfl: 3   tirzepatide (MOUNJARO) 10 MG/0.5ML Pen, Inject 10 mg into the skin once a week., Disp: 6 mL, Rfl: 2  Observations/Objective: Patient is well-developed, well-nourished in no acute distress.  Resting comfortably at home.  Head is normocephalic, atraumatic.  No labored breathing.  Speech is clear and coherent with logical content.  Patient is alert and oriented at baseline.    Assessment and Plan: 1. Febrile illness (Primary) Needs to be seen in urgent care. Labs.   Follow Up Instructions: I discussed the assessment and treatment plan with the patient. The patient was provided an opportunity to ask questions and all were answered. The patient agreed with the plan and demonstrated an understanding of the instructions.  A copy of instructions were sent to the patient via MyChart unless otherwise noted below.    The patient was advised to call back or seek an in-person evaluation if the symptoms worsen or if the condition fails to improve as anticipated.    Claiborne Rigg, NP

## 2023-04-18 ENCOUNTER — Ambulatory Visit: Payer: BC Managed Care – PPO

## 2023-04-20 ENCOUNTER — Telehealth: Payer: Self-pay

## 2023-04-20 ENCOUNTER — Ambulatory Visit (INDEPENDENT_AMBULATORY_CARE_PROVIDER_SITE_OTHER): Payer: BC Managed Care – PPO | Admitting: Family Medicine

## 2023-04-20 ENCOUNTER — Encounter: Payer: Self-pay | Admitting: Family Medicine

## 2023-04-20 VITALS — BP 100/50 | HR 83 | Temp 97.7°F | Resp 16 | Ht 64.0 in | Wt 123.0 lb

## 2023-04-20 DIAGNOSIS — R109 Unspecified abdominal pain: Secondary | ICD-10-CM

## 2023-04-20 DIAGNOSIS — J0100 Acute maxillary sinusitis, unspecified: Secondary | ICD-10-CM | POA: Diagnosis not present

## 2023-04-20 LAB — POCT URINALYSIS DIPSTICK
Blood, UA: NEGATIVE
Glucose, UA: NEGATIVE
Leukocytes, UA: NEGATIVE
Nitrite, UA: NEGATIVE
Protein, UA: POSITIVE — AB
Spec Grav, UA: >=1.03 — AB (ref 1.010–1.025)
Urobilinogen, UA: 0.2 U/dL
pH, UA: 5 (ref 5.0–8.0)

## 2023-04-20 MED ORDER — CEFDINIR 300 MG PO CAPS: 300.0000 mg | ORAL_CAPSULE | Freq: Two times a day (BID) | ORAL | 0 refills | Status: AC

## 2023-04-20 NOTE — Patient Instructions (Signed)
Continue to push fluids, practice good hand hygiene, and cover your mouth if you cough.  If you start having fevers, shaking or shortness of breath, seek immediate care.  OK to take Tylenol 1000 mg (2 extra strength tabs) or 975 mg (3 regular strength tabs) every 6 hours as needed.  Ibuprofen 400-600 mg (2-3 over the counter strength tabs) every 6 hours as needed for pain.  Let us know if you need anything.

## 2023-04-20 NOTE — Telephone Encounter (Signed)
Copied from CRM [id number]. Topic: Clinical - Lab/Test Results >> Apr 20, 2023  2:46 PM Gurney Maxin H wrote: Reason for CRM: Patient calling to speak about lab results, please reach out to patient.  [place] [phone]

## 2023-04-20 NOTE — Progress Notes (Signed)
Chief Complaint  Patient presents with   Fever    Patient reports still having a fever between 99 to 101.   Back Pain    Patient reports low back pain w/o any urinary symptoms.     Mackenzie Mcgee here for URI complaints.  Duration: 10 days  Associated symptoms: Fever (99-101 F), sinus congestion, ear fullness, shortness of breath, myalgia, and low back pain  Denies: sinus pain, rhinorrhea, itchy watery eyes, ear pain, ear drainage, sore throat, wheezing, and N/V/D, abd pain Treatment to date: tylenol, ibuprofen Sick contacts: Yes- daughter was sick initially   Past Medical History:  Diagnosis Date   COVID-19 virus infection 07/2020   Depression, recurrent (HCC) 05/19/2021   Dissection of left carotid artery (HCC) 09/17/2020   DKA (diabetic ketoacidosis) (HCC) 09/16/2020   GAD (generalized anxiety disorder) 05/19/2021   Gestational diabetes    Morbid obesity (HCC)    Ptosis of left eyelid 09/17/2020   Type 2 diabetes mellitus with hyperglycemia, with long-term current use of insulin (HCC) 09/25/2020    Objective BP (!) 100/50 (BP Location: Right Arm, Patient Position: Sitting, Cuff Size: Small)   Pulse 83   Temp 97.7 F (36.5 C) (Oral)   Resp 16   Ht 5\' 4"  (1.626 m)   Wt 123 lb (55.8 kg)   SpO2 100%   BMI 21.11 kg/m  General: Awake, alert, appears stated age HEENT: AT, [place], ears patent b/l and TM's neg, nares patent w/o discharge, pharynx pink and without exudates, MMM, ttp over max sinuses b/l Neck: No masses or asymmetry Heart: RRR Abd: BS+, S, TTP in suprapubic region, ND Lungs: CTAB, no accessory muscle use Psych: Age appropriate judgment and insight, normal mood and affect  Acute maxillary sinusitis, recurrence not specified - Plan: cefdinir (OMNICEF) 300 MG capsule  Flank pain - Plan: POCT urinalysis dipstick, Urine Culture  Ck urine. Tx as above to cover for sinusitis + complicated UTI. Continue to push fluids, practice good hand hygiene, cover mouth  when coughing. F/u prn. If starting to experience worsening s/s's, shaking, or shortness of breath, seek immediate care. Pt voiced understanding and agreement to the plan.  Mackenzie Roche [place], DO 04/20/23 9:28 AM

## 2023-04-20 NOTE — Telephone Encounter (Signed)
Called pt was advised Dr.Wendling hasn't put any notes on mychart yet.Pt stated she understand.

## 2023-04-21 LAB — URINE CULTURE
MICRO NUMBER:: 16007763
Result:: NO GROWTH
SPECIMEN QUALITY:: ADEQUATE

## 2023-04-22 ENCOUNTER — Encounter: Payer: Self-pay | Admitting: Family Medicine

## 2023-04-27 ENCOUNTER — Emergency Department (HOSPITAL_BASED_OUTPATIENT_CLINIC_OR_DEPARTMENT_OTHER): Payer: BC Managed Care – PPO

## 2023-04-27 ENCOUNTER — Other Ambulatory Visit: Payer: Self-pay

## 2023-04-27 ENCOUNTER — Encounter (HOSPITAL_BASED_OUTPATIENT_CLINIC_OR_DEPARTMENT_OTHER): Payer: Self-pay

## 2023-04-27 ENCOUNTER — Emergency Department (HOSPITAL_BASED_OUTPATIENT_CLINIC_OR_DEPARTMENT_OTHER)
Admission: EM | Admit: 2023-04-27 | Discharge: 2023-04-27 | Disposition: A | Discharge status: Home / Self Care | Payer: BC Managed Care – PPO | Attending: Emergency Medicine | Admitting: Emergency Medicine

## 2023-04-27 ENCOUNTER — Telehealth: Payer: Self-pay | Admitting: Family Medicine

## 2023-04-27 DIAGNOSIS — Z20822 Contact with and (suspected) exposure to covid-19: Secondary | ICD-10-CM | POA: Insufficient documentation

## 2023-04-27 DIAGNOSIS — Z794 Long term (current) use of insulin: Secondary | ICD-10-CM | POA: Insufficient documentation

## 2023-04-27 DIAGNOSIS — Z7982 Long term (current) use of aspirin: Secondary | ICD-10-CM | POA: Diagnosis not present

## 2023-04-27 DIAGNOSIS — E119 Type 2 diabetes mellitus without complications: Secondary | ICD-10-CM | POA: Diagnosis not present

## 2023-04-27 DIAGNOSIS — R509 Fever, unspecified: Secondary | ICD-10-CM | POA: Insufficient documentation

## 2023-04-27 DIAGNOSIS — D72819 Decreased white blood cell count, unspecified: Secondary | ICD-10-CM | POA: Diagnosis not present

## 2023-04-27 DIAGNOSIS — Z79899 Other long term (current) drug therapy: Secondary | ICD-10-CM | POA: Diagnosis not present

## 2023-04-27 LAB — COMPREHENSIVE METABOLIC PANEL
ALT: 14 U/L (ref 0–44)
AST: 16 U/L (ref 15–41)
Albumin: 3.3 g/dL — ABNORMAL LOW (ref 3.5–5.0)
Alkaline Phosphatase: 31 U/L — ABNORMAL LOW (ref 38–126)
Anion gap: 7 (ref 5–15)
BUN: 8 mg/dL (ref 6–20)
CO2: 25 mmol/L (ref 22–32)
Calcium: 8.3 mg/dL — ABNORMAL LOW (ref 8.9–10.3)
Chloride: 107 mmol/L (ref 98–111)
Creatinine, Ser: 0.5 mg/dL (ref 0.44–1.00)
GFR, Estimated: >60 mL/min (ref >60)
Glucose, Bld: 96 mg/dL (ref 70–99)
Potassium: 3.4 mmol/L — ABNORMAL LOW (ref 3.5–5.1)
Sodium: 139 mmol/L (ref 135–145)
Total Bilirubin: 0.3 mg/dL (ref 0.0–1.2)
Total Protein: 6.8 g/dL (ref 6.5–8.1)

## 2023-04-27 LAB — CBC WITH DIFFERENTIAL/PLATELET
Abs Immature Granulocytes: 0.01 10*3/uL (ref 0.00–0.07)
Basophils Absolute: 0 10*3/uL (ref 0.0–0.1)
Basophils Relative: 0 %
Eosinophils Absolute: 0 10*3/uL (ref 0.0–0.5)
Eosinophils Relative: 0 %
HCT: 32.4 % — ABNORMAL LOW (ref 36.0–46.0)
Hemoglobin: 10.8 g/dL — ABNORMAL LOW (ref 12.0–15.0)
Immature Granulocytes: 0 %
Lymphocytes Relative: 39 %
Lymphs Abs: 1.1 10*3/uL (ref 0.7–4.0)
MCH: 27.8 pg (ref 26.0–34.0)
MCHC: 33.3 g/dL (ref 30.0–36.0)
MCV: 83.3 fL (ref 80.0–100.0)
Monocytes Absolute: 0.5 10*3/uL (ref 0.1–1.0)
Monocytes Relative: 17 %
Neutro Abs: 1.2 10*3/uL — ABNORMAL LOW (ref 1.7–7.7)
Neutrophils Relative %: 44 %
Platelets: 269 10*3/uL (ref 150–400)
RBC: 3.89 MIL/uL (ref 3.87–5.11)
RDW: 13.1 % (ref 11.5–15.5)
WBC: 2.8 10*3/uL — ABNORMAL LOW (ref 4.0–10.5)
nRBC: 0 % (ref 0.0–0.2)

## 2023-04-27 LAB — URINALYSIS, W/ REFLEX TO CULTURE (INFECTION SUSPECTED)
Bilirubin Urine: NEGATIVE
Glucose, UA: NEGATIVE mg/dL
Hgb urine dipstick: NEGATIVE
Ketones, ur: NEGATIVE mg/dL
Leukocytes,Ua: NEGATIVE
Nitrite: NEGATIVE
Protein, ur: 30 mg/dL — AB
Specific Gravity, Urine: >=1.03 (ref 1.005–1.030)
pH: 6.5 (ref 5.0–8.0)

## 2023-04-27 LAB — PREGNANCY, URINE: Preg Test, Ur: NEGATIVE

## 2023-04-27 LAB — LACTIC ACID, PLASMA: Lactic Acid, Venous: 0.6 mmol/L (ref 0.5–1.9)

## 2023-04-27 LAB — RESP PANEL BY RT-PCR (RSV, FLU A&B, COVID)  RVPGX2
Influenza A by PCR: NEGATIVE
Influenza B by PCR: NEGATIVE
Resp Syncytial Virus by PCR: NEGATIVE
SARS Coronavirus 2 by RT PCR: NEGATIVE

## 2023-04-27 LAB — HIV ANTIBODY (ROUTINE TESTING W REFLEX): HIV Screen 4th Generation wRfx: NONREACTIVE

## 2023-04-27 MED ORDER — KETOROLAC TROMETHAMINE 15 MG/ML IJ SOLN
15.0000 mg | Freq: Once | INTRAMUSCULAR | Status: AC
  Administered 2023-04-27: 15 mg via INTRAVENOUS
  Filled 2023-04-27: qty 1

## 2023-04-27 MED ORDER — AMOXICILLIN-POT CLAVULANATE 875-125 MG PO TABS: 1.0000 | ORAL_TABLET | Freq: Two times a day (BID) | ORAL | 0 refills | Status: AC

## 2023-04-27 MED ORDER — IOHEXOL 300 MG/ML  SOLN
100.0000 mL | Freq: Once | INTRAMUSCULAR | Status: AC | PRN
  Administered 2023-04-27: 75 mL via INTRAVENOUS

## 2023-04-27 NOTE — Telephone Encounter (Signed)
Called pt to see about getting her in with another provider and she stated went to ER.

## 2023-04-27 NOTE — ED Triage Notes (Signed)
C/o intermittent fever x 3 weeks (up to 159f). Was on antibiotics that she completed yesterday. Temp 100.33f this morning.   C/o sinus pressure and lower back pain.

## 2023-04-27 NOTE — ED Provider Notes (Signed)
 [place] [facility] Provider Note   CSN: [id number] Arrival date & time: 04/27/23  9185     History  Chief Complaint  Patient presents with   Fever    Mackenzie Mcgee is a 43 y.o. female PMH T2DM, MDD/GAD, obesity.   Fever Associated symptoms: congestion, ear pain and myalgias   Associated symptoms: no chest pain, no cough, no diarrhea, no dysuria, no headaches, no nausea, no rash, no sore throat and no vomiting    Seen PCP 1/21 for cramping, diarrhea, nausea and fevers and was diagnosed with acute gastritis and given Zofran  and Bentyl   Seen again by PCP on 04/20/2023 Tx with cefdinir  for 7 days for acute maxillary sinusitis/complicated UTI given some flank pain.  States she still had fevers with treatment of cefdinir  and completed this yesterday.  She has been having a persistent fever of three weeks duration. The fever has been fluctuating between 99 and 102 degrees Fahrenheit, even with the use of antipyretics. The patient reports that the fever usually spikes around 9 pm and is accompanied by cold sweats when it subsides.  States that she does not usually get a fever over 100.4 every day but she is taking tylenol  and ibuprofen every 4-6 hours on the days she does not have high fever.   In addition to the fever, the patient has been experiencing facial pain, which they describe as a feeling of congestion and pressure. This discomfort extends to the neck occasionally. The patient also reports joint pain, particularly in the feet and fingers, which has been occurring intermittently.  No joint swelling.  The patient has also experienced occasional shortness of breath, but this symptom is not consistent. There have been no reports of vomiting, diarrhea, or rashes. The patient has been on an antibiotic, Cefdinir , for seven days, which was prescribed for acute maxillary sinusitis/complicated UTI. The patient finished the course of antibiotics  yesterday, but there has been no improvement in the fever.  Denies any dysuria, urgency, frequency.  No recent travel.     Home Medications Prior to Admission medications   Medication Sig Start Date End Date Taking? Authorizing Provider  amoxicillin -clavulanate (AUGMENTIN ) 875-125 MG tablet Take 1 tablet by mouth 2 (two) times daily for 5 days. 04/27/23 05/02/23 Yes Christia Budds, MD  aspirin  EC 81 MG EC tablet Take 1 tablet (81 mg total) by mouth daily. Swallow whole. 09/19/20   Amin, Sumayya, MD  blood glucose meter kit and supplies KIT Dispense based on patient and insurance preference. Use up to four times daily as directed. 09/30/20   Frann Mabel Mt, DO  buPROPion  (WELLBUTRIN  XL) 300 MG 24 hr tablet Take 1 tablet (300 mg total) by mouth daily. 01/27/23   Frann Mabel Mt, DO  cefdinir  (OMNICEF ) 300 MG capsule Take 1 capsule (300 mg total) by mouth 2 (two) times daily for 7 days. 04/20/23 04/27/23  Frann Mabel Mt, DO  dicyclomine  (BENTYL ) 10 MG capsule Take 1 tab every 6 hours as needed for abdominal cramping. 04/13/23   Frann Mabel Mt, DO  fluticasone  (FLONASE ) 50 MCG/ACT nasal spray Place 2 sprays into both nostrils daily. 12/29/21   Frann Mabel Mt, DO  hydrOXYzine  (ATARAX ) 25 MG tablet Take 1-3 tablets (25-75 mg total) by mouth every 8 (eight) hours as needed for anxiety. 01/27/23   Wendling, Mabel Mt, DO  Lancets [facility] DELICA PLUS [place]) MISC USE AS DIRECTED UP TO FOUR  TIMES  DAILY 02/28/21   Frann Mabel  Paul, DO  levocetirizine (XYZAL ) 5 MG tablet Take 1 tablet (5 mg total) by mouth every evening. 12/29/21   Frann Mabel Mt, DO  ondansetron  (ZOFRAN -ODT) 4 MG disintegrating tablet Take 1 tablet (4 mg total) by mouth every 8 (eight) hours as needed for nausea or vomiting. 04/13/23   Wendling, Mabel Mt, DO  ONETOUCH VERIO test strip USE AS DIRECTED UP TO FOUR  TIMES  DAILY 02/28/21   Frann, Mabel Mt, DO  pantoprazole   (PROTONIX ) 40 MG tablet Take 1 tablet (40 mg total) by mouth daily. 02/28/21   Frann Mabel Mt, DO  rosuvastatin  (CRESTOR ) 5 MG tablet Take 1 tablet (5 mg total) by mouth daily. 01/27/23   Frann Mabel Mt, DO  tirzepatide  (MOUNJARO ) 10 MG/0.5ML Pen Inject 10 mg into the skin once a week. 04/13/23   Frann Mabel Mt, DO      Allergies    Patient has no known allergies.    Review of Systems   Review of Systems  Constitutional:  Positive for fever. Negative for appetite change.  HENT:  Positive for congestion, ear pain, sinus pressure and sinus pain. Negative for facial swelling, sore throat and trouble swallowing.   Eyes:  Negative for redness.  Respiratory:  Negative for cough and shortness of breath.   Cardiovascular:  Negative for chest pain and leg swelling.  Gastrointestinal:  Negative for abdominal distention, abdominal pain, diarrhea, nausea and vomiting.  Genitourinary:  Negative for dysuria and urgency.  Musculoskeletal:  Positive for arthralgias and myalgias. Negative for neck stiffness.  Skin:  Negative for rash.  Neurological:  Negative for headaches.  Hematological:  Positive for adenopathy.    Physical Exam Updated Vital Signs BP 104/60 (BP Location: Right Arm)   Pulse 75   Temp 98.5 F (36.9 C) (Oral)   Resp 18   Ht 5' 4 (1.626 m)   Wt 55.8 kg   LMP 04/21/2023 (Approximate)   SpO2 99%   BMI 21.11 kg/m  Physical Exam Vitals and nursing note reviewed.  Constitutional:      General: She is not in acute distress.    Appearance: She is well-developed. She is not ill-appearing or toxic-appearing.  HENT:     Head: Normocephalic and atraumatic.     Comments: Tenderness along maxillary, frontal sinuses to palpation    Right Ear: Tympanic membrane and ear canal normal.     Left Ear: Tympanic membrane and ear canal normal.     Nose: Congestion present.     Mouth/Throat:     Mouth: Mucous membranes are moist.     Pharynx: No oropharyngeal exudate  or posterior oropharyngeal erythema.  Eyes:     Conjunctiva/sclera: Conjunctivae normal.  Neck:     Comments: Mild shotty tender cervical adenopathy Cardiovascular:     Rate and Rhythm: Normal rate and regular rhythm.     Heart sounds: No murmur heard. Pulmonary:     Effort: Pulmonary effort is normal. No respiratory distress.     Breath sounds: Normal breath sounds.  Abdominal:     Palpations: Abdomen is soft.     Tenderness: There is no abdominal tenderness.     Comments: Mild left flank pain on exam but no CVA tenderness  Musculoskeletal:        General: No swelling.     Cervical back: Neck supple.  Lymphadenopathy:     Cervical: Cervical adenopathy present.  Skin:    General: Skin is warm and dry.     Capillary Refill: Capillary  refill takes less than 2 seconds.     Comments: No swelling around joints of hand but some pain with movement.  Neurological:     Mental Status: She is alert.  Psychiatric:        Mood and Affect: Mood normal.     ED Results / Procedures / Treatments   Labs (all labs ordered are listed, but only abnormal results are displayed) Labs Reviewed  COMPREHENSIVE METABOLIC PANEL - Abnormal; Notable for the following components:      Result Value   Potassium 3.4 (*)    Calcium  8.3 (*)    Albumin 3.3 (*)    Alkaline Phosphatase 31 (*)    All other components within normal limits  CBC WITH DIFFERENTIAL/PLATELET - Abnormal; Notable for the following components:   WBC 2.8 (*)    Hemoglobin 10.8 (*)    HCT 32.4 (*)    Neutro Abs 1.2 (*)    All other components within normal limits  URINALYSIS, W/ REFLEX TO CULTURE (INFECTION SUSPECTED) - Abnormal; Notable for the following components:   Protein, ur 30 (*)    Bacteria, UA RARE (*)    All other components within normal limits  RESP PANEL BY RT-PCR (RSV, FLU A&B, COVID)  RVPGX2  LACTIC ACID, PLASMA  PREGNANCY, URINE  LACTIC ACID, PLASMA  HIV ANTIBODY (ROUTINE TESTING W REFLEX)     EKG None  Radiology CT Maxillofacial W Contrast Result Date: 04/27/2023 CLINICAL DATA:  Provided history: Sinusitis, chronic or recurrent. Facial pain. Fever. EXAM: CT MAXILLOFACIAL WITH CONTRAST TECHNIQUE: Multidetector CT imaging of the maxillofacial structures was performed with intravenous contrast. Multiplanar CT image reconstructions were also generated. RADIATION DOSE REDUCTION: This exam was performed according to the departmental dose-optimization program which includes automated exposure control, adjustment of the mA and/or kV according to patient size and/or use of iterative reconstruction technique. CONTRAST:  75mL OMNIPAQUE  IOHEXOL  300 MG/ML  SOLN COMPARISON:  Brain MRI 09/17/2020. FINDINGS: Osseous: 12 mm focus of periapical lucency (consistent with periodontal disease and possible periapical abscess) surrounding the roots of the right mandibular first molar tooth, and to a lesser extent, surrounding the roots of the right mandibular second molar tooth (for instance as seen on series 604, images 27-30). This periapical lucency extends to involve the outer cortex of the right mandible (for instance as seen on series 303, image 24). Subtle periapical lucency consistent with periodontal disease also present about the right mandibular second premolar tooth (series 604, image 30). Orbits: No orbital mass or acute orbital finding. Sinuses: No significant paranasal sinus disease. Soft tissues: Streak/beam hardening artifact arising from dental restoration partially obscures the oral cavity and perioral soft tissues. Within this limitation, no perimandibular soft tissue inflammatory changes or soft tissue abscess identified. Limited intracranial: No evidence of an acute intracranial abnormality within the field of view. IMPRESSION: 1. 12 mm focus of periapical lucency surrounding the roots of the right mandibular first molar tooth, and to a lesser extent surrounding the roots of the right mandibular  second molar tooth. The periapical lucency extends to involve the outer cortex of the right mandible. This is compatible with periodontal disease (and possible periapical abscess). 2. Subtle periapical lucency (consistent with periodontal disease) also present about the right mandibular second premolar tooth. 3. Streak/beam hardening artifact arising from dental restoration partially obscures the oral cavity and perioral soft tissues. Within this limitation, no perimandibular soft tissue inflammatory changes or soft tissue abscess identified. 4. No significant paranasal sinus disease. Electronically Signed  By: Rockey Childs D.O.   On: 04/27/2023 11:57   DG Chest 2 View Result Date: 04/27/2023 CLINICAL DATA:  Fever. EXAM: CHEST - 2 VIEW COMPARISON:  09/17/2020. FINDINGS: Bilateral lung fields are clear. Bilateral costophrenic angles are clear. Normal cardio-mediastinal silhouette. No acute osseous abnormalities. The soft tissues are within normal limits. IMPRESSION: No active cardiopulmonary disease. Electronically Signed   By: Ree Molt M.D.   On: 04/27/2023 09:07    Procedures Procedures   Medications Ordered in ED Medications  ketorolac  (TORADOL ) 15 MG/ML injection 15 mg (15 mg Intravenous Given 04/27/23 1047)  iohexol  (OMNIPAQUE ) 300 MG/ML solution 100 mL (75 mLs Intravenous Contrast Given 04/27/23 1050)    ED Course/ Medical Decision Making/ A&P                                Medical Decision Making Amount and/or Complexity of Data Reviewed Labs: ordered. Radiology: ordered.   Medical Decision Making:   Mackenzie Mcgee is a 43 y.o. female who presented to the ED today with intermittent fevers for the past 3 weeks detailed above.    External chart has been reviewed including PCP notes. Complete initial physical exam performed, notably the patient  was tender to palpation along sinuses of face.    Reviewed and confirmed nursing documentation for past medical history, family  history, social history.    Initial Assessment:   With the patient's presentation of intermittent fevers, most likely diagnosis is possible facial infection ?sinusitis. Other diagnoses were considered including (but not limited to) viral URI, rheumatologic condition, HIV, pneumonia. These are considered less likely due to history of present illness and physical exam findings.    Initial Plan:  Respiratory panel, LA, HIV Screening labs including CBC and Metabolic panel to evaluate for infectious or metabolic etiology of disease.  Urinalysis with reflex culture ordered to evaluate for UTI or relevant urologic/nephrologic pathology.  CXR to evaluate for structural/infectious intrathoracic pathology Objective evaluation as below reviewed   Initial Study Results:   Laboratory  All laboratory results reviewed without evidence of clinically relevant pathology.   Exceptions include: Leukopenia to 2.8, mild anemia to 10.8, ANC 1.2, spec grav >1.03  Radiology:  All images reviewed independently. Agree with radiology report at this time.   CT Maxillofacial W Contrast Result Date: 04/27/2023 CLINICAL DATA:  Provided history: Sinusitis, chronic or recurrent. Facial pain. Fever. EXAM: CT MAXILLOFACIAL WITH CONTRAST TECHNIQUE: Multidetector CT imaging of the maxillofacial structures was performed with intravenous contrast. Multiplanar CT image reconstructions were also generated. RADIATION DOSE REDUCTION: This exam was performed according to the departmental dose-optimization program which includes automated exposure control, adjustment of the mA and/or kV according to patient size and/or use of iterative reconstruction technique. CONTRAST:  75mL OMNIPAQUE  IOHEXOL  300 MG/ML  SOLN COMPARISON:  Brain MRI 09/17/2020. FINDINGS: Osseous: 12 mm focus of periapical lucency (consistent with periodontal disease and possible periapical abscess) surrounding the roots of the right mandibular first molar tooth, and to a  lesser extent, surrounding the roots of the right mandibular second molar tooth (for instance as seen on series 604, images 27-30). This periapical lucency extends to involve the outer cortex of the right mandible (for instance as seen on series 303, image 24). Subtle periapical lucency consistent with periodontal disease also present about the right mandibular second premolar tooth (series 604, image 30). Orbits: No orbital mass or acute orbital finding. Sinuses: No significant paranasal sinus disease.  Soft tissues: Streak/beam hardening artifact arising from dental restoration partially obscures the oral cavity and perioral soft tissues. Within this limitation, no perimandibular soft tissue inflammatory changes or soft tissue abscess identified. Limited intracranial: No evidence of an acute intracranial abnormality within the field of view. IMPRESSION: 1. 12 mm focus of periapical lucency surrounding the roots of the right mandibular first molar tooth, and to a lesser extent surrounding the roots of the right mandibular second molar tooth. The periapical lucency extends to involve the outer cortex of the right mandible. This is compatible with periodontal disease (and possible periapical abscess). 2. Subtle periapical lucency (consistent with periodontal disease) also present about the right mandibular second premolar tooth. 3. Streak/beam hardening artifact arising from dental restoration partially obscures the oral cavity and perioral soft tissues. Within this limitation, no perimandibular soft tissue inflammatory changes or soft tissue abscess identified. 4. No significant paranasal sinus disease. Electronically Signed   By: Rockey Childs D.O.   On: 04/27/2023 11:57   DG Chest 2 View Result Date: 04/27/2023 CLINICAL DATA:  Fever. EXAM: CHEST - 2 VIEW COMPARISON:  09/17/2020. FINDINGS: Bilateral lung fields are clear. Bilateral costophrenic angles are clear. Normal cardio-mediastinal silhouette. No acute  osseous abnormalities. The soft tissues are within normal limits. IMPRESSION: No active cardiopulmonary disease. Electronically Signed   By: Ree Molt M.D.   On: 04/27/2023 09:07    Final Assessment and Plan:   Patient with intermittent fevers and facial pain - CT with possible periapical abscess of right mandibular 1st molar and 2nd molar. No significant sinus disease. Does have leukopenia to 2.8, sent out HIV testing as well. Will cover with augmentin  and discussed patient to follow up with dental provider for for tooth removal/drainage.  Did discuss with patient to follow-up with primary care physician for consideration of rheumatologic workup as well given nightly fevers and joint pain.  She is clinically stable throughout ED visit and does not require admission for this.    Clinical Impression:  1. Fever, unspecified fever cause      Discharge   Final Clinical Impression(s) / ED Diagnoses Final diagnoses:  Fever, unspecified fever cause    Rx / DC Orders ED Discharge Orders          Ordered    amoxicillin -clavulanate (AUGMENTIN ) 875-125 MG tablet  2 times daily        04/27/23 1222              Christia Budds, MD 04/27/23 1225    Kingsley, Victoria K, DO 04/27/23 1520

## 2023-04-27 NOTE — Telephone Encounter (Signed)
 Copied from CRM [id number]. Topic: General - Other >> Apr 26, 2023  1:38 PM Mackenzie Mcgee wrote: Reason for CRM: patient called stating she has been in the office 2x for a fever that she had three weeks ago and she still has a fever. Patient was given antibiotics and her fever is only controlled if she takes tylenol 

## 2023-04-27 NOTE — Discharge Instructions (Signed)
 It did show that there may be an abscess around your right first molar and second molar and we are putting you on antibiotics called Augmentin  to treat you for 5 days but you will need to follow-up with your dentist for possible drainage/tooth removal.  We do recommend following up with your PCP for considering other possibilities for these nightly fevers with joint pain including rheumatologic conditions.  We have also sent out an HIV test and we will keep you updated

## 2023-07-16 ENCOUNTER — Other Ambulatory Visit (HOSPITAL_BASED_OUTPATIENT_CLINIC_OR_DEPARTMENT_OTHER): Payer: Self-pay | Admitting: Family Medicine

## 2023-07-16 DIAGNOSIS — Z1231 Encounter for screening mammogram for malignant neoplasm of breast: Secondary | ICD-10-CM

## 2023-07-20 ENCOUNTER — Telehealth: Admitting: Family Medicine

## 2023-07-20 ENCOUNTER — Encounter: Payer: Self-pay | Admitting: Family Medicine

## 2023-07-20 DIAGNOSIS — H1031 Unspecified acute conjunctivitis, right eye: Secondary | ICD-10-CM | POA: Diagnosis not present

## 2023-07-20 MED ORDER — POLYMYXIN B-TRIMETHOPRIM 10000-0.1 UNIT/ML-% OP SOLN: 1.0000 [drp] | OPHTHALMIC | 0 refills | Status: DC

## 2023-07-20 NOTE — Progress Notes (Signed)
 Chief Complaint  Patient presents with   Conjunctivitis    Red Eyes    Mackenzie Mcgee is here for right eye irritation. We are interacting via web portal for an electronic face-to-face visit. I verified patient's ID using 2 identifiers. Patient agreed to proceed with visit via this method. Patient is at home, I am at office. Patient and I are present for visit.   Duration: 9 days Chemical exposure? No  +redness, drainage Trauma? No Recent URI? No  Contact lenses? Yes- did not leave them in History of allergies? No  Treatment to date: lubricant drops Dad w similar s/s's.   Past Medical History:  Diagnosis Date   COVID-19 virus infection 07/2020   Depression, recurrent (HCC) 05/19/2021   Dissection of left carotid artery (HCC) 09/17/2020   DKA (diabetic ketoacidosis) (HCC) 09/16/2020   GAD (generalized anxiety disorder) 05/19/2021   Gestational diabetes    Morbid obesity (HCC)    Ptosis of left eyelid 09/17/2020   Type 2 diabetes mellitus with hyperglycemia, with long-term current use of insulin  (HCC) 09/25/2020   Obj No conversational dyspnea Age appropriate judgment and insight Nml affect and mood  Acute conjunctivitis of right eye, unspecified acute conjunctivitis type - Plan: trimethoprim-polymyxin b (POLYTRIM) ophthalmic solution  Polytrim drops for 7 to 10 days.  She has an appointment with her eye doctor in a couple days. Instructed to practice good hand hygiene and try not to touch face. Warm compresses and artificial tears also recommended. F/u as originally scheduled or as needed. Pt voiced understanding and agreement to the plan.  Mackenzie Dials Haviland, DO 07/20/23 10:35 AM

## 2023-07-21 ENCOUNTER — Ambulatory Visit

## 2023-07-21 DIAGNOSIS — Z1231 Encounter for screening mammogram for malignant neoplasm of breast: Secondary | ICD-10-CM

## 2023-07-26 ENCOUNTER — Other Ambulatory Visit: Payer: Self-pay | Admitting: Family Medicine

## 2023-07-26 ENCOUNTER — Telehealth: Payer: Self-pay

## 2023-07-26 DIAGNOSIS — R928 Other abnormal and inconclusive findings on diagnostic imaging of breast: Secondary | ICD-10-CM

## 2023-07-26 NOTE — Telephone Encounter (Signed)
 The imaging team takes care of this and I sign off on it. Looks like it was taken care of today though.

## 2023-07-26 NOTE — Telephone Encounter (Signed)
 Copied from CRM [id number]. Topic: Clinical - Request for Lab/Test Order >> Jul 26, 2023  8:08 AM Mackenzie Mcgee wrote: Reason for CRM: Patient was informed that her mammography screening came out abnormal and she needs to do a diagnostic mammogram. She would like to know if Dr.Wendling would be able to place the order.

## 2023-07-28 ENCOUNTER — Encounter: Payer: Self-pay | Admitting: Family Medicine

## 2023-07-28 ENCOUNTER — Ambulatory Visit (INDEPENDENT_AMBULATORY_CARE_PROVIDER_SITE_OTHER): Payer: BC Managed Care – PPO | Admitting: Family Medicine

## 2023-07-28 VITALS — BP 110/68 | HR 72 | Temp 98.0°F | Resp 16 | Ht 64.0 in | Wt 124.0 lb

## 2023-07-28 DIAGNOSIS — Z0000 Encounter for general adult medical examination without abnormal findings: Secondary | ICD-10-CM | POA: Diagnosis not present

## 2023-07-28 DIAGNOSIS — E1165 Type 2 diabetes mellitus with hyperglycemia: Secondary | ICD-10-CM

## 2023-07-28 DIAGNOSIS — M79642 Pain in left hand: Secondary | ICD-10-CM

## 2023-07-28 DIAGNOSIS — Z794 Long term (current) use of insulin: Secondary | ICD-10-CM | POA: Diagnosis not present

## 2023-07-28 DIAGNOSIS — M79641 Pain in right hand: Secondary | ICD-10-CM | POA: Diagnosis not present

## 2023-07-28 NOTE — Patient Instructions (Addendum)
Give us 2-3 business days to get the results of your labs back.   Keep the diet clean and stay active.  Please consider adding some weight resistance exercise to your routine. Consider yoga as well.   Please get me a copy of your advanced directive form at your convenience.   Let us know if you need anything.  

## 2023-07-28 NOTE — Progress Notes (Signed)
 Chief Complaint  Patient presents with   Annual Exam    Annual Exam     Well Woman Mackenzie Mcgee is here for a complete physical.   Her last physical was >1 year ago.  Current diet: in general, a "healthy" diet. Current exercise: walking. Weight is stable and she denies fatigue out of ordinary. Patient's last menstrual period was 07/02/2023. Seatbelt? Yes Advanced directive? No; has the form   Health Maintenance Pap/HPV- Yes Mammogram- Yes Tetanus- Yes Hep C screening- Yes HIV screening- Yes  Past Medical History:  Diagnosis Date   COVID-19 virus infection 07/2020   Depression, recurrent (HCC) 05/19/2021   Dissection of left carotid artery (HCC) 09/17/2020   DKA (diabetic ketoacidosis) (HCC) 09/16/2020   GAD (generalized anxiety disorder) 05/19/2021   Gestational diabetes    Morbid obesity (HCC)    Ptosis of left eyelid 09/17/2020   Type 2 diabetes mellitus with hyperglycemia, with long-term current use of insulin  (HCC) 09/25/2020     Past Surgical History:  Procedure Laterality Date   HERNIA REPAIR     WISDOM TOOTH EXTRACTION  2014    Medications  Current Outpatient Medications on File Prior to Visit  Medication Sig Dispense Refill   aspirin  EC 81 MG EC tablet Take 1 tablet (81 mg total) by mouth daily. Swallow whole. 30 tablet 11   blood glucose meter kit and supplies KIT Dispense based on patient and insurance preference. Use up to four times daily as directed. 1 each 0   buPROPion  (WELLBUTRIN  XL) 300 MG 24 hr tablet Take 1 tablet (300 mg total) by mouth daily. 90 tablet 3   dicyclomine  (BENTYL ) 10 MG capsule Take 1 tab every 6 hours as needed for abdominal cramping. 30 capsule 0   fluticasone  (FLONASE ) 50 MCG/ACT nasal spray Place 2 sprays into both nostrils daily. 16 g 6   hydrOXYzine  (ATARAX ) 25 MG tablet Take 1-3 tablets (25-75 mg total) by mouth every 8 (eight) hours as needed for anxiety. 30 tablet 1   Lancets (ONETOUCH DELICA PLUS LANCET33G) MISC  USE AS DIRECTED UP TO FOUR  TIMES  DAILY 100 each 6   levocetirizine (XYZAL ) 5 MG tablet Take 1 tablet (5 mg total) by mouth every evening. 30 tablet 2   ondansetron  (ZOFRAN -ODT) 4 MG disintegrating tablet Take 1 tablet (4 mg total) by mouth every 8 (eight) hours as needed for nausea or vomiting. 20 tablet 0   ONETOUCH VERIO test strip USE AS DIRECTED UP TO FOUR  TIMES  DAILY 100 each 0   pantoprazole  (PROTONIX ) 40 MG tablet Take 1 tablet (40 mg total) by mouth daily. 30 tablet 1   rosuvastatin  (CRESTOR ) 5 MG tablet Take 1 tablet (5 mg total) by mouth daily. 90 tablet 3   tirzepatide  (MOUNJARO ) 10 MG/0.5ML Pen Inject 10 mg into the skin once a week. 6 mL 2   trimethoprim -polymyxin b  (POLYTRIM ) ophthalmic solution Place 1 drop into the right eye every 4 (four) hours. 10 mL 0    Allergies No Known Allergies  Review of Systems: Constitutional:  no unexpected weight changes Eye:  no recent significant change in vision Ear/Nose/Mouth/Throat:  Ears:  no recent change in hearing Nose/Mouth/Throat:  no complaints of nasal congestion, no sore throat Cardiovascular: no chest pain Respiratory:  no shortness of breath Gastrointestinal:  no abdominal pain, no change in bowel habits GU:  Female: negative for dysuria or pelvic pain Musculoskeletal/Extremities:  no pain of the joints Integumentary (Skin/Breast):  no abnormal skin lesions  reported Neurologic:  no headaches Endocrine:  denies fatigue Hematologic/Lymphatic:  No areas of easy bleeding  Exam BP 110/68 (BP Location: Left Arm, Patient Position: Sitting)   Pulse 72   Temp 98 F (36.7 C) (Oral)   Resp 16   Ht 5\' 4"  (1.626 m)   Wt 124 lb (56.2 kg)   LMP 07/02/2023   SpO2 98%   BMI 21.28 kg/m  General:  well developed, well nourished, in no apparent distress Skin:  no significant moles, warts, or growths Head:  no masses, lesions, or tenderness Eyes:  pupils equal and round, sclera anicteric without injection Ears:  canals without  lesions, TMs shiny without retraction, no obvious effusion, no erythema Nose:  nares patent, mucosa normal, and no drainage Throat/Pharynx:  lips and gingiva without lesion; tongue and uvula midline; non-inflamed pharynx; no exudates or postnasal drainage Neck: neck supple without adenopathy, thyromegaly, or masses Lungs:  clear to auscultation, breath sounds equal bilaterally, no respiratory distress Cardio:  regular rate and rhythm, no LE edema Abdomen:  abdomen soft, nontender; bowel sounds normal; no masses or organomegaly Genital: Defer to GYN Musculoskeletal:  symmetrical muscle groups noted without atrophy or deformity Extremities:  no clubbing, cyanosis, or edema, no deformities, no skin discoloration Neuro:  gait normal; deep tendon reflexes normal and symmetric Psych: well oriented with normal range of affect and appropriate judgment/insight  Assessment and Plan  Well adult exam - Plan: CBC, Comprehensive metabolic panel with GFR, Lipid panel  Type 2 diabetes mellitus with hyperglycemia, with long-term current use of insulin  (HCC) - Plan: Hemoglobin A1c  Bilateral hand pain - Plan: ANA,IFA RA Diag Pnl w/rflx Tit/Patn, Anti-Smith antibody, Anti-DNA antibody, double-stranded, Rheumatoid Factor, Aldolase, Sedimentation rate, Sjogrens syndrome-A extractable nuclear antibody, Sjogrens syndrome-B extractable nuclear antibody   Well 43 y.o. female. Counseled on diet and exercise. Advanced directive form provided today.  F/u on hand pain with above labs.  Other orders as above. Follow up in 6 mo. The patient voiced understanding and agreement to the plan.  [name] [place], DO 07/28/23 9:47 AM

## 2023-07-29 LAB — ANTI-SMITH ANTIBODY: ENA SM Ab Ser-aCnc: <1 AI

## 2023-07-29 NOTE — Addendum Note (Signed)
 Addended by: Susa Engman A on: 07/29/2023 03:07 PM   Modules accepted: Orders

## 2023-07-30 ENCOUNTER — Encounter: Payer: Self-pay | Admitting: Family Medicine

## 2023-07-30 ENCOUNTER — Other Ambulatory Visit

## 2023-07-31 LAB — ANA,IFA RA DIAG PNL W/RFLX TIT/PATN
Cyclic Citrullin Peptide Ab: <16 U
Rheumatoid fact SerPl-aCnc: <16 U (ref <14)
Rheumatoid fact SerPl-aCnc: POSITIVE [IU]/mL — AB

## 2023-07-31 LAB — SJOGRENS SYNDROME-A EXTRACTABLE NUCLEAR ANTIBODY: SSA (Ro) (ENA) Antibody, IgG: <1 AI

## 2023-07-31 LAB — ANTI-NUCLEAR AB-TITER (ANA TITER): ANA Titer 1: 1:40 {titer} — ABNORMAL HIGH

## 2023-07-31 LAB — ANTI-DNA ANTIBODY, DOUBLE-STRANDED: ds DNA Ab: 1 [IU]/mL

## 2023-07-31 LAB — ALDOLASE: Aldolase: 4.7 U/L (ref <=8.1)

## 2023-07-31 LAB — SJOGRENS SYNDROME-B EXTRACTABLE NUCLEAR ANTIBODY: SSB (La) (ENA) Antibody, IgG: <1 AI

## 2023-08-03 ENCOUNTER — Ambulatory Visit
Admission: RE | Admit: 2023-08-03 | Discharge: 2023-08-03 | Disposition: A | Discharge status: Home / Self Care | Source: Ambulatory Visit | Attending: Family Medicine

## 2023-08-03 ENCOUNTER — Other Ambulatory Visit (INDEPENDENT_AMBULATORY_CARE_PROVIDER_SITE_OTHER)

## 2023-08-03 ENCOUNTER — Other Ambulatory Visit: Payer: Self-pay | Admitting: Family Medicine

## 2023-08-03 DIAGNOSIS — Z794 Long term (current) use of insulin: Secondary | ICD-10-CM

## 2023-08-03 DIAGNOSIS — Z0000 Encounter for general adult medical examination without abnormal findings: Secondary | ICD-10-CM | POA: Diagnosis not present

## 2023-08-03 DIAGNOSIS — M79642 Pain in left hand: Secondary | ICD-10-CM | POA: Diagnosis not present

## 2023-08-03 DIAGNOSIS — R921 Mammographic calcification found on diagnostic imaging of breast: Secondary | ICD-10-CM

## 2023-08-03 DIAGNOSIS — E1165 Type 2 diabetes mellitus with hyperglycemia: Secondary | ICD-10-CM

## 2023-08-03 DIAGNOSIS — M79641 Pain in right hand: Secondary | ICD-10-CM

## 2023-08-03 DIAGNOSIS — R928 Other abnormal and inconclusive findings on diagnostic imaging of breast: Secondary | ICD-10-CM

## 2023-08-04 ENCOUNTER — Ambulatory Visit: Payer: Self-pay | Admitting: Family Medicine

## 2023-08-04 LAB — COMPREHENSIVE METABOLIC PANEL WITH GFR
ALT: 17 U/L (ref 0–35)
AST: 13 U/L (ref 0–37)
Albumin: 4.7 g/dL (ref 3.5–5.2)
Alkaline Phosphatase: 30 U/L — ABNORMAL LOW (ref 39–117)
BUN: 12 mg/dL (ref 6–23)
CO2: 31 meq/L (ref 19–32)
Calcium: 9.7 mg/dL (ref 8.4–10.5)
Chloride: 102 meq/L (ref 96–112)
Creatinine, Ser: 0.63 mg/dL (ref 0.40–1.20)
GFR: 110.37 mL/min (ref >60.00)
Glucose, Bld: 99 mg/dL (ref 70–99)
Potassium: 4.1 meq/L (ref 3.5–5.1)
Sodium: 139 meq/L (ref 135–145)
Total Bilirubin: 0.3 mg/dL (ref 0.2–1.2)
Total Protein: 7.4 g/dL (ref 6.0–8.3)

## 2023-08-04 LAB — CBC
HCT: 41.4 % (ref 36.0–46.0)
Hemoglobin: 13.8 g/dL (ref 12.0–15.0)
MCHC: 33.4 g/dL (ref 30.0–36.0)
MCV: 85.7 fl (ref 78.0–100.0)
Platelets: 342 10*3/uL (ref 150.0–400.0)
RBC: 4.83 Mil/uL (ref 3.87–5.11)
RDW: 13.6 % (ref 11.5–15.5)
WBC: 7.8 10*3/uL (ref 4.0–10.5)

## 2023-08-04 LAB — LIPID PANEL
Cholesterol: 191 mg/dL (ref 0–200)
HDL: 94.8 mg/dL (ref >39.00)
LDL Cholesterol: 86 mg/dL (ref 0–99)
NonHDL: 96.32
Total CHOL/HDL Ratio: 2
Triglycerides: 54 mg/dL (ref 0.0–149.0)
VLDL: 10.8 mg/dL (ref 0.0–40.0)

## 2023-08-04 LAB — HEMOGLOBIN A1C: Hgb A1c MFr Bld: 5.6 % (ref 4.6–6.5)

## 2023-08-04 LAB — SEDIMENTATION RATE: Sed Rate: 2 mm/h (ref 0–20)

## 2023-08-11 ENCOUNTER — Ambulatory Visit
Admission: RE | Admit: 2023-08-11 | Discharge: 2023-08-11 | Disposition: A | Discharge status: Home / Self Care | Source: Ambulatory Visit | Attending: Family Medicine | Admitting: Family Medicine

## 2023-08-11 DIAGNOSIS — R921 Mammographic calcification found on diagnostic imaging of breast: Secondary | ICD-10-CM

## 2023-08-11 HISTORY — PX: BREAST BIOPSY: SHX20

## 2023-08-12 LAB — SURGICAL PATHOLOGY

## 2023-11-10 ENCOUNTER — Ambulatory Visit (INDEPENDENT_AMBULATORY_CARE_PROVIDER_SITE_OTHER): Admitting: Obstetrics and Gynecology

## 2023-11-10 ENCOUNTER — Other Ambulatory Visit (HOSPITAL_COMMUNITY)
Admission: RE | Admit: 2023-11-10 | Discharge: 2023-11-10 | Disposition: A | Discharge status: Home / Self Care | Source: Ambulatory Visit | Attending: Obstetrics and Gynecology | Admitting: Obstetrics and Gynecology

## 2023-11-10 ENCOUNTER — Encounter: Payer: Self-pay | Admitting: Obstetrics and Gynecology

## 2023-11-10 VITALS — BP 100/44 | HR 73 | Ht 64.5 in | Wt 131.0 lb

## 2023-11-10 DIAGNOSIS — Z01419 Encounter for gynecological examination (general) (routine) without abnormal findings: Secondary | ICD-10-CM | POA: Insufficient documentation

## 2023-11-10 DIAGNOSIS — Z113 Encounter for screening for infections with a predominantly sexual mode of transmission: Secondary | ICD-10-CM | POA: Insufficient documentation

## 2023-11-10 NOTE — Progress Notes (Signed)
 ANNUAL EXAM Patient name: Mackenzie Mcgee MRN [id number]  Date of birth: 06/06/1980 Chief Complaint:   Gynecologic Exam (Annual with STI check )  History of Present Illness:   Mackenzie Mcgee is a 43 y.o. G2P1011 being seen today for a routine annual exam.  Current complaints: annual STI  Menstrual concerns? Yes  shorter and lighter Breast or nipple changes? No  Contraception use? No return to abstinence Sexually active? Yes female partner, recently restarted activity partner, partner disclosed bisexuality and decision made to discontinue relationship   Menses started today, no vaginal complaints otherwise, will defer internal exam.   Patient's last menstrual period was 11/08/2023 (exact date).   The pregnancy intention screening data noted above was reviewed. Potential methods of contraception were discussed. The patient elected to proceed with No data recorded.   Last pap     Component Value Date/Time   DIAGPAP  11/20/2022 0849    - Negative for intraepithelial lesion or malignancy (NILM)   HPVHIGH Negative 11/20/2022 0849   ADEQPAP  11/20/2022 0849    Satisfactory for evaluation; transformation zone component PRESENT.   Last mammogram: 08/11/23 breast bx benign, recommend annual screening in 1year  Last colonoscopy: n/a.      11/10/2023    1:58 PM 07/28/2023    9:29 AM 11/20/2022    8:51 AM 11/19/2022    9:36 AM 12/29/2021    9:02 AM  Depression screen PHQ 2/9  Decreased Interest 0 0 0  0  Down, Depressed, Hopeless 0 0 0  0  PHQ - 2 Score 0 0 0  0  Altered sleeping 0 0 1  0  Tired, decreased energy 0 0 1  0  Change in appetite 0 0 0  0  Feeling bad or failure about yourself  0 0 0  0  Trouble concentrating 0 0 0  0  Moving slowly or fidgety/restless 0 0 0  0  Suicidal thoughts 0 0 0  0  PHQ-9 Score 0 0 2  0  Difficult doing work/chores  Not difficult at all   Not difficult at all     Information is confidential and restricted. Go to Review Flowsheets to  unlock data.        11/10/2023    1:58 PM 07/28/2023    9:29 AM 11/20/2022    8:52 AM  GAD 7 : Generalized Anxiety Score  Nervous, Anxious, on Edge 0 0 1  Control/stop worrying 0 0 1  Worry too much - different things 0 0 1  Trouble relaxing 0 0 1  Restless 0 0 1  Easily annoyed or irritable 0 0 0  Afraid - awful might happen 0 0 0  Total GAD 7 Score 0 0 5  Anxiety Difficulty  Not difficult at all      Review of Systems:   Pertinent items are noted in HPI Denies any headaches, blurred vision, fatigue, shortness of breath, chest pain, abdominal pain, abnormal vaginal discharge/itching/odor/irritation, problems with periods, bowel movements, urination, or intercourse unless otherwise stated above. Pertinent History Reviewed:  Reviewed past medical,surgical, social and family history.  Reviewed problem list, medications and allergies. Physical Assessment:   Vitals:   11/10/23 1349  BP: (!) 100/44  Pulse: 73  Weight: 131 lb (59.4 kg)  Height: 5' 4.5 (1.638 m)  Body mass index is 22.14 kg/m.        Physical Examination:   General appearance - well appearing, and in no distress  Mental status -  alert, oriented to person, place, and time  Psych:  She has a normal mood and affect  Skin - warm and dry, normal color, no suspicious lesions noted  Chest - effort normal, all lung fields clear to auscultation bilaterally  Heart - normal rate and regular rhythm  Breasts - breasts appear normal, no suspicious masses, no skin or nipple changes or  axillary nodes, small scar in upper outer quadrant of left breast  Abdomen - soft, nontender, nondistended, no masses or organomegaly  Pelvic -  VULVA: normal appearing vulva with no masses, tenderness or lesions   VAGINA: blood noted at introitus Extremities:  No swelling or varicosities noted  Chaperone present for exam  No results found for this or any previous visit (from the past 24 hours).    Assessment & Plan:  1. Well woman  exam with routine gynecological exam (Primary) - Cervical cancer screening: Discussed guidelines. Pap with HPV up to date - STD Testing: accepts - Birth Control: Discussed options and their risks, benefits and common side effects; discussed VTE with estrogen containing options. Desires: abstinence - Breast Health: Encouraged self breast awareness/SBE. Teaching provided. Discussed limits of clinical breast exam for detecting breast cancer. MXR is up to date: 07/2023 - F/U 12 months and prn  - Cytology - PAP( [place]) - Cervicovaginal ancillary only( ) - RPR+HBsAg+HCVAb+...       Orders Placed This Encounter  Procedures   RPR+HBsAg+HCVAb+...    Meds: No orders of the defined types were placed in this encounter.   Follow-up: No follow-ups on file.  Carter Quarry, MD 11/10/2023 2:10 PM

## 2023-11-11 LAB — RPR+HBSAG+HCVAB+...
HIV Screen 4th Generation wRfx: NONREACTIVE
Hep C Virus Ab: NONREACTIVE
Hepatitis B Surface Ag: NEGATIVE
RPR Ser Ql: NONREACTIVE

## 2023-11-12 ENCOUNTER — Ambulatory Visit: Payer: Self-pay | Admitting: Obstetrics and Gynecology

## 2023-11-15 LAB — CERVICOVAGINAL ANCILLARY ONLY
Bacterial Vaginitis (gardnerella): POSITIVE — AB
Candida Glabrata: NEGATIVE
Candida Vaginitis: NEGATIVE
Chlamydia: NEGATIVE
Comment: NEGATIVE
Comment: NEGATIVE
Comment: NEGATIVE
Comment: NEGATIVE
Comment: NEGATIVE
Comment: NORMAL
Neisseria Gonorrhea: NEGATIVE
Trichomonas: NEGATIVE

## 2023-11-17 ENCOUNTER — Other Ambulatory Visit (HOSPITAL_BASED_OUTPATIENT_CLINIC_OR_DEPARTMENT_OTHER): Payer: Self-pay

## 2023-11-17 ENCOUNTER — Other Ambulatory Visit: Payer: Self-pay

## 2023-11-17 DIAGNOSIS — B9689 Other specified bacterial agents as the cause of diseases classified elsewhere: Secondary | ICD-10-CM

## 2023-11-17 MED ORDER — METRONIDAZOLE 500 MG PO TABS
500.0000 mg | ORAL_TABLET | Freq: Two times a day (BID) | ORAL | 0 refills | Status: AC
  Filled 2023-11-17: qty 14, 7d supply, fill #0

## 2023-12-25 ENCOUNTER — Other Ambulatory Visit: Payer: Self-pay | Admitting: Family Medicine

## 2023-12-27 ENCOUNTER — Encounter: Payer: Self-pay | Admitting: Family Medicine

## 2023-12-28 ENCOUNTER — Other Ambulatory Visit: Payer: Self-pay | Admitting: Family Medicine

## 2023-12-28 MED ORDER — TIRZEPATIDE 12.5 MG/0.5ML ~~LOC~~ SOAJ: 12.5000 mg | SUBCUTANEOUS | 2 refills | Status: AC

## 2024-01-16 ENCOUNTER — Encounter: Payer: Self-pay | Admitting: Family Medicine

## 2024-01-18 ENCOUNTER — Ambulatory Visit: Admitting: Family Medicine

## 2024-01-18 ENCOUNTER — Encounter: Payer: Self-pay | Admitting: Family Medicine

## 2024-01-18 VITALS — BP 100/62 | HR 100 | Temp 98.0°F | Resp 16 | Ht 64.0 in | Wt 128.8 lb

## 2024-01-18 DIAGNOSIS — J069 Acute upper respiratory infection, unspecified: Secondary | ICD-10-CM

## 2024-01-18 LAB — POCT INFLUENZA A/B
Influenza A, POC: NEGATIVE
Influenza B, POC: NEGATIVE

## 2024-01-18 LAB — POCT RAPID STREP A (OFFICE): Rapid Strep A Screen: NEGATIVE

## 2024-01-18 LAB — POC COVID19 BINAXNOW: SARS Coronavirus 2 Ag: NEGATIVE

## 2024-01-18 NOTE — Addendum Note (Signed)
 Addended by: Akisha Sturgill M on: 01/18/2024 11:58 AM   Modules accepted: Orders

## 2024-01-18 NOTE — Patient Instructions (Addendum)
Continue to push fluids, practice good hand hygiene, and cover your mouth if you cough.  If you start having fevers, shaking or shortness of breath, seek immediate care.  OK to take Tylenol 1000 mg (2 extra strength tabs) or 975 mg (3 regular strength tabs) every 6 hours as needed.  As long as you are vomiting or having diarrhea, please drink fluids with electrolytes like Gatorade, Powerade, or Pedialyte (if you can stomach the taste). Drink these along with water mainly.   Let us know if you need anything.

## 2024-01-18 NOTE — Progress Notes (Signed)
 Chief Complaint  Patient presents with   Cough    Cough and Stuff Noise     Mackenzie Mcgee here for URI complaints.  Duration: 3 days  Associated symptoms: Fever (99-100 F), sinus congestion, rhinorrhea, red eyes, sore throat, myalgia, and abd pain, coughing, diarrhea Denies: sinus pain, ear pain, ear drainage, wheezing, shortness of breath, and N/V Treatment to date: Tylenol  Sick contacts: No  Past Medical History:  Diagnosis Date   COVID-19 virus infection 07/2020   Depression, recurrent 05/19/2021   Dissection of left carotid artery 09/17/2020   DKA (diabetic ketoacidosis) (HCC) 09/16/2020   GAD (generalized anxiety disorder) 05/19/2021   Gestational diabetes    H/O eye surgery    Morbid obesity (HCC)    Ptosis of left eyelid 09/17/2020   Type 2 diabetes mellitus with hyperglycemia, with long-term current use of insulin  (HCC) 09/25/2020    Objective BP 100/62 (BP Location: Left Arm, Patient Position: Sitting)   Pulse 100   Temp 98 F (36.7 C) (Oral)   Resp 16   Ht 5' 4 (1.626 m)   Wt 128 lb 12.8 oz (58.4 kg)   SpO2 99%   BMI 22.11 kg/m  General: Awake, alert, appears stated age HEENT: AT, [place], ears patent b/l and TM's neg, nares patent w/o discharge, pharynx pink and without exudates, MMM, no sinus ttp Neck: No masses or asymmetry Heart: RRR Abd: BS+, S, NT, ND Lungs: CTAB, no accessory muscle use Psych: Age appropriate judgment and insight, normal mood and affect  Viral URI with cough  Continue to push fluids, practice good hand hygiene, cover mouth when coughing. Tylenol  prn. COVID, flu and strep testing neg.  Letter for work given.  F/u prn. If starting to experience worsening s/s's, shaking, or shortness of breath, seek immediate care. Pt voiced understanding and agreement to the plan.  [name], DO 01/18/24 11:36 AM

## 2024-01-23 ENCOUNTER — Encounter: Payer: Self-pay | Admitting: Family Medicine

## 2024-01-24 ENCOUNTER — Other Ambulatory Visit: Payer: Self-pay | Admitting: Family Medicine

## 2024-01-24 MED ORDER — POLYMYXIN B-TRIMETHOPRIM 10000-0.1 UNIT/ML-% OP SOLN: 1.0000 [drp] | OPHTHALMIC | 0 refills | Status: DC

## 2024-01-28 ENCOUNTER — Ambulatory Visit: Admitting: Family Medicine

## 2024-01-31 ENCOUNTER — Ambulatory Visit: Admitting: Family Medicine

## 2024-02-11 ENCOUNTER — Ambulatory Visit: Admitting: Family Medicine

## 2024-02-11 ENCOUNTER — Encounter: Payer: Self-pay | Admitting: Family Medicine

## 2024-02-11 VITALS — BP 110/64 | HR 90 | Temp 98.0°F | Resp 16 | Ht 64.0 in | Wt 130.2 lb

## 2024-02-11 DIAGNOSIS — F339 Major depressive disorder, recurrent, unspecified: Secondary | ICD-10-CM

## 2024-02-11 DIAGNOSIS — Z794 Long term (current) use of insulin: Secondary | ICD-10-CM | POA: Diagnosis not present

## 2024-02-11 DIAGNOSIS — E1165 Type 2 diabetes mellitus with hyperglycemia: Secondary | ICD-10-CM | POA: Diagnosis not present

## 2024-02-11 LAB — LIPID PANEL
Cholesterol: 125 mg/dL (ref 0–200)
HDL: 63.7 mg/dL (ref >39.00)
LDL Cholesterol: 51 mg/dL (ref 0–99)
NonHDL: 61.47
Total CHOL/HDL Ratio: 2
Triglycerides: 52 mg/dL (ref 0.0–149.0)
VLDL: 10.4 mg/dL (ref 0.0–40.0)

## 2024-02-11 LAB — COMPREHENSIVE METABOLIC PANEL WITH GFR
ALT: 10 U/L (ref 0–35)
AST: 12 U/L (ref 0–37)
Albumin: 4.5 g/dL (ref 3.5–5.2)
Alkaline Phosphatase: 35 U/L — ABNORMAL LOW (ref 39–117)
BUN: 12 mg/dL (ref 6–23)
CO2: 26 meq/L (ref 19–32)
Calcium: 9 mg/dL (ref 8.4–10.5)
Chloride: 102 meq/L (ref 96–112)
Creatinine, Ser: 0.66 mg/dL (ref 0.40–1.20)
GFR: 108.74 mL/min (ref >60.00)
Glucose, Bld: 77 mg/dL (ref 70–99)
Potassium: 3.7 meq/L (ref 3.5–5.1)
Sodium: 137 meq/L (ref 135–145)
Total Bilirubin: 0.7 mg/dL (ref 0.2–1.2)
Total Protein: 7.2 g/dL (ref 6.0–8.3)

## 2024-02-11 LAB — MICROALBUMIN / CREATININE URINE RATIO
Creatinine,U: 258.8 mg/dL
Microalb Creat Ratio: 12.7 mg/g (ref 0.0–30.0)
Microalb, Ur: 3.3 mg/dL — ABNORMAL HIGH (ref 0.0–1.9)

## 2024-02-11 LAB — HEMOGLOBIN A1C: Hgb A1c MFr Bld: 5.5 % (ref 4.6–6.5)

## 2024-02-11 MED ORDER — LEVOCETIRIZINE DIHYDROCHLORIDE 5 MG PO TABS
5.0000 mg | ORAL_TABLET | Freq: Every evening | ORAL | 2 refills | Status: AC
Start: 2024-02-11 — End: ?

## 2024-02-11 MED ORDER — BUPROPION HCL ER (XL) 150 MG PO TB24
150.0000 mg | ORAL_TABLET | Freq: Every day | ORAL | 0 refills | Status: AC
Start: 2024-02-11 — End: ?

## 2024-02-11 NOTE — Patient Instructions (Signed)
 Give us  2-3 business days to get the results of your labs back.   Keep the diet clean and stay active.  Let us  know if you need anything.

## 2024-02-11 NOTE — Progress Notes (Signed)
 Subjective:   Chief Complaint  Patient presents with   Diabetes    Diabetes Check    Mackenzie Mcgee is a 43 y.o. female here for follow-up of diabetes.   Mackenzie Mcgee does not routinely check her sugars.  Patient does not require insulin .   Medications include: Mounjaro  12.5 mg weekly Diet is healthy.  Exercise: Walking No chest pain or shortness of breath.  Depression Currently on Wellbutrin  XL 300 mg daily.  Compliant, no adverse effects.  No homicidal or suicidal ideation.  No self-medication.  She is not following with therapist for now.  Past Medical History:  Diagnosis Date   COVID-19 virus infection 07/2020   Depression, recurrent 05/19/2021   Dissection of left carotid artery 09/17/2020   DKA (diabetic ketoacidosis) (HCC) 09/16/2020   GAD (generalized anxiety disorder) 05/19/2021   Gestational diabetes    H/O eye surgery    Morbid obesity (HCC)    Ptosis of left eyelid 09/17/2020   Type 2 diabetes mellitus with hyperglycemia, with long-term current use of insulin  (HCC) 09/25/2020     Related testing: Retinal exam: Done Pneumovax: done  Objective:  BP 110/64 (BP Location: Left Arm, Patient Position: Sitting)   Pulse 90   Temp 98 F (36.7 C) (Oral)   Resp 16   Ht 5' 4 (1.626 m)   Wt 130 lb 3.2 oz (59.1 kg)   SpO2 98%   BMI 22.35 kg/m  General:  Well developed, well nourished, in no apparent distress Skin:  Warm, no pallor or diaphoresis Head:  Normocephalic, atraumatic Eyes:  Pupils equal and round, sclera anicteric without injection  Lungs:  CTAB, no access msc use Cardio:  RRR, no bruits, no LE edema Musculoskeletal:  Symmetrical muscle groups noted without atrophy or deformity Neuro:  Sensation intact to pinprick on feet Psych: Age appropriate judgment and insight  Assessment:   Type 2 diabetes mellitus with hyperglycemia, with long-term current use of insulin  (HCC) - Plan: Comprehensive metabolic panel with GFR, Lipid panel, Hemoglobin A1c,  Microalbumin / creatinine urine ratio  Depression, recurrent   Plan:   Chronic, stable.  Continue Mounjaro  12.5 mg weekly, Crestor  5 mg daily.  Counseled on diet and exercise. Chronic, stable.  Decrease Wellbutrin  XL 150 mg daily for 30 d and then stop. F/u in 6 mo. The patient voiced understanding and agreement to the plan.  Mackenzie [place], DO 02/11/24 1:00 PM

## 2024-02-12 ENCOUNTER — Ambulatory Visit: Payer: Self-pay | Admitting: Family Medicine
